# Patient Record
Sex: Female | Born: 1975 | Hispanic: No | Marital: Married | Smoking: Never smoker
Health system: Southern US, Community
[De-identification: ages and names within clinical notes are randomized; demographics above are authoritative.]

## PROBLEM LIST (undated history)

## (undated) DIAGNOSIS — G459 Transient cerebral ischemic attack, unspecified: Secondary | ICD-10-CM

## (undated) HISTORY — PX: TUBAL LIGATION: SHX77

## (undated) HISTORY — PX: APPENDECTOMY: SHX54

## (undated) HISTORY — DX: Transient cerebral ischemic attack, unspecified: G45.9

---

## 2005-07-19 ENCOUNTER — Observation Stay (HOSPITAL_COMMUNITY): Admission: EM | Admit: 2005-07-19 | Discharge: 2005-07-20 | Payer: Self-pay | Admitting: Emergency Medicine

## 2005-11-03 ENCOUNTER — Ambulatory Visit (HOSPITAL_COMMUNITY): Admission: RE | Admit: 2005-11-03 | Discharge: 2005-11-03 | Payer: Self-pay | Admitting: Obstetrics and Gynecology

## 2006-01-16 ENCOUNTER — Ambulatory Visit (HOSPITAL_COMMUNITY): Admission: AD | Admit: 2006-01-16 | Discharge: 2006-01-16 | Payer: Self-pay | Admitting: Obstetrics and Gynecology

## 2006-02-06 ENCOUNTER — Ambulatory Visit (HOSPITAL_COMMUNITY): Admission: RE | Admit: 2006-02-06 | Discharge: 2006-02-06 | Payer: Self-pay | Admitting: Obstetrics and Gynecology

## 2006-02-18 ENCOUNTER — Ambulatory Visit (HOSPITAL_COMMUNITY): Admission: AD | Admit: 2006-02-18 | Discharge: 2006-02-18 | Payer: Self-pay | Admitting: Obstetrics and Gynecology

## 2006-02-24 ENCOUNTER — Inpatient Hospital Stay (HOSPITAL_COMMUNITY): Admission: AD | Admit: 2006-02-24 | Discharge: 2006-02-27 | Payer: Self-pay | Admitting: Obstetrics and Gynecology

## 2006-04-24 ENCOUNTER — Ambulatory Visit (HOSPITAL_COMMUNITY): Admission: RE | Admit: 2006-04-24 | Discharge: 2006-04-24 | Payer: Self-pay | Admitting: Orthopedic Surgery

## 2006-11-22 ENCOUNTER — Emergency Department (HOSPITAL_COMMUNITY): Admission: EM | Admit: 2006-11-22 | Discharge: 2006-11-22 | Payer: Self-pay | Admitting: Emergency Medicine

## 2006-12-24 ENCOUNTER — Ambulatory Visit (HOSPITAL_COMMUNITY): Admission: RE | Admit: 2006-12-24 | Discharge: 2006-12-24 | Payer: Self-pay | Admitting: Internal Medicine

## 2007-06-15 ENCOUNTER — Other Ambulatory Visit: Admission: RE | Admit: 2007-06-15 | Discharge: 2007-06-15 | Payer: Self-pay | Admitting: Obstetrics and Gynecology

## 2009-03-18 ENCOUNTER — Emergency Department (HOSPITAL_COMMUNITY): Admission: EM | Admit: 2009-03-18 | Discharge: 2009-03-18 | Payer: Self-pay | Admitting: Emergency Medicine

## 2010-02-03 ENCOUNTER — Emergency Department (HOSPITAL_COMMUNITY): Admission: EM | Admit: 2010-02-03 | Discharge: 2010-02-04 | Payer: Self-pay | Admitting: Pediatrics

## 2010-12-10 LAB — POCT PREGNANCY, URINE: Preg Test, Ur: NEGATIVE

## 2010-12-10 LAB — URINALYSIS, ROUTINE W REFLEX MICROSCOPIC
Bilirubin Urine: NEGATIVE
Glucose, UA: NEGATIVE mg/dL
Hgb urine dipstick: NEGATIVE
Ketones, ur: NEGATIVE mg/dL
Nitrite: NEGATIVE
Protein, ur: NEGATIVE mg/dL
Specific Gravity, Urine: 1.01 (ref 1.005–1.030)
Urobilinogen, UA: 0.2 mg/dL (ref 0.0–1.0)
pH: 6 (ref 5.0–8.0)

## 2011-02-08 NOTE — Group Therapy Note (Signed)
Carrie [name], Carrie [name]           ACCOUNT NO.:  [id number]   MEDICAL RECORD NO.:  [id number]          PATIENT TYPE:  OIB   LOCATION:  LDR1                          FACILITY:  [facility]   PHYSICIAN:  Tilda Burrow, M.D. DATE OF BIRTH:  [birth year]   DATE OF PROCEDURE:  DATE OF DISCHARGE:  02/06/2006                                   PROGRESS NOTE   Karyme is about [redacted] weeks pregnant.  Came in with complaints of  contractions.  She was crying apparently when she came in, rating her pain a  10/10.  She was having contractions about every 4-5 minutes.  The fetal  heart rate was reactive; however, her cervix was still firm, finger tip,  uneffaced, -2 station.   At her request we gave her something for pain which was 10 mg IM of  morphine.  She went to sleep and basically quit contracting.  She was  observed in labor and delivery for approximately four hours.  Her cervix was  examined prior to discharge, and it was unchanged.   IMPRESSION:  1.  Intrauterine pregnancy at about 38 weeks.  2.  False labor.   PLAN:  Roneka was instructed to return to labor and delivery if she  resumed having contractions again that were stronger than what she had  already experienced, spontaneous rupture of membranes or any other concerns.      Jacklyn Shell, C.N.M.      Tilda Burrow, M.D.  Electronically Signed    FC/MEDQ  D:  02/06/2006  T:  02/07/2006  Job:  [id number]

## 2011-02-08 NOTE — Op Note (Signed)
[name], [name]           ACCOUNT NO.:  [id number]   MEDICAL RECORD NO.:  [id number]          PATIENT TYPE:  INP   LOCATION:  A413                          FACILITY:  [facility]   PHYSICIAN:  Tilda Burrow, M.D. DATE OF BIRTH:  [birth year]   DATE OF PROCEDURE:  02/25/2006  DATE OF DISCHARGE:                                 OPERATIVE REPORT   DELIVERY TIME:  1251 p.m.   LABOR SUMMARY AND DELIVERY NOTE:  Called to see patient shortly before noon  due to repetitive decelerations, intermittently into the 60s with variable  length from 15 to 60 seconds.  Good beat to beat variability persisted.  The  patient had reached 8 cm, -1 to 0 station, 100% effaced.  She was able to  progress.  We elevated the head between contractions.  The cervix stretched  over and she was completed dilated by 12:15.  Second stage variables were  notable.  The vertex was a right occiput transverse position during this  part of labor.  She progressed with good descent and at 12:31 we attempted  vacuum assistance at a +2 or lower station to see if the vertex would rotate  into better position.  We initially did not get a good seal so we waited and  approximately 15 minutes later attempted vacuum assistance again.  By this  time she had brought the baby down into an outlet position.  Vertex rotated  nicely through three contractions into a right occiput anterior position,  and delivered over a small median episiotomy without difficulty.  Once the  head was delivered, the left arm could be released from its anterior  position and the patient expelled with bulb suctioning of the nose and  pharynx prior to delivery of the rest of the body.  Amniotic fluid was clear  after deliver of the head.  Infant weighed 7 pounds 2.6 ounces, Apgars 9 and  9.  We will check blood gases.  Secondary episiotomy was repaired without  difficulty.  Postdelivery her temperature was 99.3 and pulse was 104.  She  appeared stable.   ESTIMATED BLOOD LOSS:  450 mL.   Placenta delivered intact.  Tomasa Blase presentation, 3-vessel cord confirmed.  The patient did well as did the baby.      Tilda Burrow, M.D.  Electronically Signed     JVF/MEDQ  D:  02/25/2006  T:  02/26/2006  Job:  [id number]

## 2011-02-08 NOTE — Group Therapy Note (Signed)
NAMEKORTNEY, POTVIN           ACCOUNT NO.:  [id number]   MEDICAL RECORD NO.:  [id number]          PATIENT TYPE:  OIB   LOCATION:  A415                          FACILITY:  [facility]   PHYSICIAN:  Tilda Burrow, M.D. DATE OF BIRTH:  [birth year]   DATE OF PROCEDURE:  DATE OF DISCHARGE:  02/18/2006                                   PROGRESS NOTE   Marguerette came in with complaints of decreased fetal movement and painful  contractions. She was having some mild contractions however her cervix was  unchanged from all of her other exams which was finger tip, very long.  Fetal heart rate was reactive without decelerations.  Her urinalysis was  negative. She was therefore discharged home with instructions to return to  labor and delivery if the contractions got worse.   IMPRESSION:  Intrauterine pregnancy at 38 weeks, pains of pregnancy.  Reactive non-stress test.      Jacklyn Shell, C.N.M.      Tilda Burrow, M.D.  Electronically Signed    FC/MEDQ  D:  02/20/2006  T:  02/20/2006  Job:  [id number]

## 2011-02-08 NOTE — Consult Note (Signed)
Carrie [name], Carrie [name]           ACCOUNT NO.:  [id number]   MEDICAL RECORD NO.:  [id number]          PATIENT TYPE:  OIB   LOCATION:  LDR1                          FACILITY:  [facility]   PHYSICIAN:  Tilda Burrow, M.D. DATE OF BIRTH:  1976-05-17   DATE OF CONSULTATION:  11/03/2005  DATE OF DISCHARGE:  11/03/2005                                   CONSULTATION   EMERGENCY ROOM VISIT:   CHIEF COMPLAINT:  Vaginal bleeding x2 days.   This 35 year old female gravida 1 para 0 now [redacted] weeks gestation by criteria  is seen in labor and delivery after calling the night before complaining of  vaginal bleeding, light spotting, described without any precipitating  incident, no intercourse, bowel movement, straining, etc., no gush of fluid.  The patient presents on November 03, 2005 where speculum exam reveals a  heavy vaginal discharge mildly malodorous.  Wet prep shows numerous clue  cells, no evidence of Trichomonas, cervix is fragile in spots with contact  with Q-Tip, GC and Chlamydia cultures are performed.  Transvaginal  ultrasound is performed limited by Jannifer Franklin.   Ultrasound reveals singleton vertex fetus with cervix long, closed, without  any evidence of placenta previa.   Wet prep results treated with MetroGel per vagina at bedtime x1 week.  The  patient's followup appointment previously scheduled for November 04, 2005  will be delayed 2 weeks.  Follow up [facility] 2 weeks.   ADDENDUM:  Fetal monitoring x15 minutes shows excellent fetal heart activity  and fetal movement.      Tilda Burrow, M.D.  Electronically Signed     JVF/MEDQ  D:  11/03/2005  T:  11/03/2005  Job:  [id number]

## 2011-02-08 NOTE — H&P (Signed)
NAMEKENOSHA, Carrie [name]           ACCOUNT NO.:  [id number]   MEDICAL RECORD NO.:  [id number]          PATIENT TYPE:  OBV   LOCATION:  A426                          FACILITY:  [facility]   PHYSICIAN:  Tilda Burrow, M.D. DATE OF BIRTH:  02-15-1976   DATE OF ADMISSION:  07/19/2005  DATE OF DISCHARGE:  LH                                HISTORY & PHYSICAL   CHIEF COMPLAINT:  Epigastric discomfort and abdominal pain at 7 to [redacted] weeks  gestation.   HISTORY OF PRESENT ILLNESS:  This 35 year old [place] female was seen in  the emergency room by Dr. Hilario Quarry on early morning hours of July 19, 2005 with complaints of upper abdominal discomfort of four days severity  without discharge or bleeding. She is stable condition. She had one prenatal  visit in our office last week with ultrasound performed at that time.  Records are not immediately available. Pelvic exam was negative in the  emergency room.   Laboratory evaluation included hemoglobin 14, hematocrit 42, white count  8,500, blood type A positive. Electrolytes showed BUN 5, creatinine 0.8.  Lipase is normal. Quantitative HCG of 127,000.   She was given fluids, Phenergan, and monitored. This discomfort in the  epigastric area is still considered a 4/10. She was admitted for overnight  observation with consideration of an ultrasound in the a.m. Since patient  has had an ultrasound at [facility] yesterday, we will attempt to  obtain ultrasound report from last week to confirm intrauterine location of  pregnancy. If so, ultrasound unlikely to be required.      Tilda Burrow, M.D.  Electronically Signed     JVF/MEDQ  D:  07/19/2005  T:  07/19/2005  Job:  [id number]   cc:   [facility]

## 2011-02-08 NOTE — H&P (Signed)
NAMEROBY, SPALLA           ACCOUNT NO.:  [id number]   MEDICAL RECORD NO.:  [id number]           PATIENT TYPE:   LOCATION:                                 FACILITY:   PHYSICIAN:  Tilda Burrow, M.D. DATE OF BIRTH:  1976/07/07   DATE OF ADMISSION:  02/24/2006  DATE OF DISCHARGE:  LH                                HISTORY & PHYSICAL   DATE OF ADMISSION:  February 24, 2006   REASON FOR ADMISSION:  Pregnancy at 39 weeks 2 days with prodromal labor on  and off during the weekend.  She was kept up since 3 a.m. this morning with  regular contractions.  They palpate mild to moderate here in the office.   MEDICAL HISTORY:  Negative.   SURGICAL HISTORY:  Positive for appendix.   ALLERGIES:  She has no known allergies.   FAMILY HISTORY:  Positive for coronary artery disease and cancer.   PRENATAL COURSE:  Essentially uneventful.  Blood type is A positive.  Rubella is immune.  Hepatitis B surface antigen negative.  HIV negative.  HSV negative.  Serology is nonreactive.  Pap is normal.  GC and chlamydia is  negative.  GBS is negative.  Repeat GC and chlamydia is also negative.  A 28-  week hemoglobin 13.3, 28-week hematocrit 37.8.  One-hour glucose 96.   PHYSICAL EXAMINATION TODAY:  VITAL SIGNS:  Weight is 166, blood pressure  130/80, fundal height is 39 cm. Fetal heart rate 130, strong and regular.  Fetal movement noted with Leopold's maneuver.  PELVIC:  Cervix is 1 cm, 70% effaced, and -2 station.  ABDOMEN:  The patient is having irregular contractions, mild to moderate in  palpation.   PLAN:  We are going to put her in this evening for a Foley bulb induction of  labor if she does not progress into labor by the end of the day.      Zerita Boers, Lanier Clam      Tilda Burrow, M.D.  Electronically Signed    DL/MEDQ  D:  40/98/1191  T:  02/24/2006  Job:  [id number]   cc:   Francoise Schaumann. Milford Cage DO, FAAP  Fax: [phone]

## 2011-02-08 NOTE — Op Note (Signed)
Carrie [name], Carrie [name] NO.:  [id number]   MEDICAL RECORD NO.:  [id number]          PATIENT TYPE:  INP   LOCATION:  A413                          FACILITY:  [facility]   PHYSICIAN:  Tilda Burrow, M.D. DATE OF BIRTH:  [birth year]   DATE OF PROCEDURE:  DATE OF DISCHARGE:                                 OPERATIVE REPORT   dictation cancelled by me      Tilda Burrow, M.D.  Electronically Signed     JVF/MEDQ  D:  02/25/2006  T:  02/26/2006  Job:  [id number]

## 2011-02-08 NOTE — Op Note (Signed)
NAMECHYRL, ELWELL           ACCOUNT NO.:  [id number]   MEDICAL RECORD NO.:  [id number]          PATIENT TYPE:  INP   LOCATION:  A413                          FACILITY:  [facility]   PHYSICIAN:  Lazaro Arms, M.D.   DATE OF BIRTH:  June 16, 1976   DATE OF PROCEDURE:  02/25/2006  DATE OF DISCHARGE:  02/27/2006                                 OPERATIVE REPORT   PROCEDURE:  Epidural placement.   Carrie [name] is a 35 year old female, gravida 1, in active phase of labor,  requesting epidural to be placed.  She was placed in sitting position.  Betadine prep was used.  Lidocaine 1% was used to numb the L3-L4 interspace.  The area was sterilely draped and a 17 gauge Tuohy needle was used and loss  of resistance technique employed and the epidural space found with one pass  without difficulty.  Then 10 mL of 0.125% bupivacaine plain was given into  the epidural space without difficulty.  An additional 10 mL was given to the  epidural catheter, which was fed without difficulty and taped down 5 cm from  the epidural space.  A continuous infusion of 0.125% bupivacaine plus 2  micrograms per mL of fentanyl was begun at 12 mL an hour.  The patient  tolerated the procedure well.  She is getting good pain-relief.      Lazaro Arms, M.D.  Electronically Signed     LHE/MEDQ  D:  04/10/2006  T:  04/10/2006  Job:  [id number]

## 2013-04-03 ENCOUNTER — Encounter (HOSPITAL_COMMUNITY): Payer: Self-pay

## 2013-04-03 ENCOUNTER — Emergency Department (HOSPITAL_COMMUNITY)
Admission: EM | Admit: 2013-04-03 | Discharge: 2013-04-03 | Disposition: A | Discharge status: Home / Self Care | Payer: 59 | Attending: Emergency Medicine | Admitting: Emergency Medicine

## 2013-04-03 ENCOUNTER — Emergency Department (HOSPITAL_COMMUNITY): Payer: 59

## 2013-04-03 DIAGNOSIS — Z9851 Tubal ligation status: Secondary | ICD-10-CM | POA: Insufficient documentation

## 2013-04-03 DIAGNOSIS — R0602 Shortness of breath: Secondary | ICD-10-CM | POA: Insufficient documentation

## 2013-04-03 DIAGNOSIS — R079 Chest pain, unspecified: Secondary | ICD-10-CM | POA: Insufficient documentation

## 2013-04-03 LAB — COMPREHENSIVE METABOLIC PANEL
ALT: 23 U/L (ref 0–35)
AST: 22 U/L (ref 0–37)
Albumin: 4.2 g/dL (ref 3.5–5.2)
BUN: 15 mg/dL (ref 6–23)
CO2: 29 mEq/L (ref 19–32)
Calcium: 9.9 mg/dL (ref 8.4–10.5)
Chloride: 101 mEq/L (ref 96–112)
Creatinine, Ser: 0.73 mg/dL (ref 0.50–1.10)
GFR calc non Af Amer: >90 mL/min (ref >90)
Glucose, Bld: 94 mg/dL (ref 70–99)
Total Bilirubin: 0.2 mg/dL — ABNORMAL LOW (ref 0.3–1.2)
Total Protein: 8.2 g/dL (ref 6.0–8.3)

## 2013-04-03 LAB — CBC WITH DIFFERENTIAL/PLATELET
Basophils Absolute: 0 10*3/uL (ref 0.0–0.1)
Basophils Relative: 0 % (ref 0–1)
Eosinophils Absolute: 0.1 10*3/uL (ref 0.0–0.7)
Eosinophils Relative: 1 % (ref 0–5)
HCT: 39.8 % (ref 36.0–46.0)
Hemoglobin: 14 g/dL (ref 12.0–15.0)
MCH: 31.2 pg (ref 26.0–34.0)
MCHC: 35.2 g/dL (ref 30.0–36.0)
MCV: 88.6 fL (ref 78.0–100.0)
Monocytes Absolute: 0.5 10*3/uL (ref 0.1–1.0)
Monocytes Relative: 7 % (ref 3–12)
Neutro Abs: 4.8 10*3/uL (ref 1.7–7.7)
Neutrophils Relative %: 64 % (ref 43–77)
RDW: 12.7 % (ref 11.5–15.5)
WBC: 7.5 10*3/uL (ref 4.0–10.5)

## 2013-04-03 LAB — TROPONIN I
Troponin I: <0.3 ng/mL (ref <0.30)
Troponin I: <0.3 ng/mL (ref <0.30)

## 2013-04-03 MED ORDER — ASPIRIN 325 MG PO TABS
324.0000 mg | ORAL_TABLET | Freq: Once | ORAL | Status: AC
  Administered 2013-04-03: 324 mg via ORAL
  Filled 2013-04-03: qty 1

## 2013-04-03 NOTE — ED Notes (Signed)
Po fluids and snack provided.  

## 2013-04-03 NOTE — ED Notes (Signed)
Gave patient sponge to wet mouth

## 2013-04-03 NOTE — ED Provider Notes (Signed)
History  This chart was scribed for Carrie Gaskins, MD by Bennett Scrape, ED Scribe. This patient was seen in room APA10/APA10 and the patient's care was started at 7:23 AM.  CSN: [id number]  Arrival date & time 04/03/13  0714   First MD Initiated Contact with Patient 04/03/13 [phone]     Chief Complaint  Patient presents with  . Chest Pain    Patient is a 37 y.o. female presenting with chest pain. The history is provided by the patient. No language interpreter was used.  Chest Pain Pain location:  L chest Pain radiates to:  L arm Pain radiates to the back: no   Pain severity:  Moderate Duration:  1 day Timing:  Constant Progression:  Improving Chronicity:  New Worsened by:  Deep breathing Ineffective treatments:  None tried Associated symptoms: shortness of breath   Associated symptoms: no abdominal pain, no cough, no fever and no nausea     HPI Comments: Carrie [name] is a 38 y.o. female who presents to the Emergency Department complaining of intermittent left-sided CP that started yesterday but became constant overnight with associated SOB. She reports that the pain occasionally radiates down her left arm and admits that the pain has improved without medications since last night. She reports minimal worsening of pain with deep breathing. She states that she felt a similar brief pain 5 days ago but it resolved on it's own. She denies any prior episodes of similar symptoms. She denies any recent cough, fever, abdominal pain, syncope and nausea as associated symptoms. She denies any h/o cardiac or pulmonary conditions and denies any prior PE or DVT diagnoses. She has a family h/o early MI with her mother and brother. Pt does not have a h/o chronic medical conditions and she denies smoking and alcohol use.  PMH - none  Past Surgical History  Procedure Laterality Date  . Tubal ligation    . Appendectomy     No family history on file. History  Substance Use Topics  .  Smoking status: Never Smoker   . Smokeless tobacco: Not on file  . Alcohol Use: No   No OB history provided.  Review of Systems  Constitutional: Negative for fever.  Respiratory: Positive for shortness of breath. Negative for cough.   Cardiovascular: Positive for chest pain.  Gastrointestinal: Negative for nausea and abdominal pain.  All other systems reviewed and are negative.    Allergies  Ciprofloxacin  Home Medications  No current outpatient prescriptions on file.  Triage Vitals: BP 128/87  Pulse 94  Temp(Src) 98 F (36.7 C) (Oral)  Resp 18  Ht 5\' 5"  (1.651 m)  Wt 165 lb (74.844 kg)  BMI 27.46 kg/m2  SpO2 97%  LMP 03/23/2013  Physical Exam  Nursing note and vitals reviewed.  CONSTITUTIONAL: Well developed/well nourished HEAD: Normocephalic/atraumatic EYES: EOMI/PERRL ENMT: Mucous membranes moist NECK: supple no meningeal signs SPINE:entire spine nontender CV: S1/S2 noted, no murmurs/rubs/gallops noted LUNGS: Lungs are clear to auscultation bilaterally, no apparent distress ABDOMEN: soft, nontender, no rebound or guarding GU:no cva tenderness NEURO: Pt is awake/alert, moves all extremitiesx4 EXTREMITIES: pulses normal, full ROM SKIN: warm, color normal PSYCH: no abnormalities of mood noted  ED Course  Procedures   Medications  aspirin tablet 324 mg (324 mg Oral Given 04/03/13 0742)    DIAGNOSTIC STUDIES: Oxygen Saturation is 97% on room air, normal by my interpretation.    COORDINATION OF CARE: 7:31 AM-Discussed treatment plan which includes ASA, CXR, CBC panel, CMP and  troponin with pt at bedside and pt agreed to plan.  8:59 AM Pt now pain free She is well appearing, appears low risk for ACS.  Will repeat troponin/ekg at 3 hr mark.  Suspicion for PE/Dissection is low 11:56 AM Pt stable, pain free, no further complaints, will d/c home Discussed strict return precautions  Labs Reviewed  COMPREHENSIVE METABOLIC PANEL - Abnormal; Notable for the  following:    Total Bilirubin 0.2 (*)    All other components within normal limits  CBC WITH DIFFERENTIAL  TROPONIN I   Dg Chest Portable 1 View  04/03/2013   *RADIOLOGY REPORT*  Clinical Data: Chest pain.  CHEST - 1 VIEW  Comparison:  02/04/2010  Findings: The heart size and mediastinal contours are within normal limits.  Both lungs are clear.  IMPRESSION: No active disease.   Original Report Authenticated By: Irish Lack, M.D.      MDM  Nursing notes including past medical history and social history reviewed and considered in documentation xrays reviewed and considered Labs/vital reviewed and considered      Date: 04/03/2013 0732  Rate: 98  Rhythm: normal sinus rhythm  QRS Axis: normal  Intervals: normal  ST/T Wave abnormalities: normal  Conduction Disutrbances:none   Date: 04/03/2013 1057am  Rate: 76  Rhythm: normal sinus rhythm  QRS Axis: normal  Intervals: normal  ST/T Wave abnormalities: nonspecific ST changes  Conduction Disutrbances:none  Narrative Interpretation:   Old EKG Reviewed: no significant changes from earlier EKG ?inverted t wave in V2, but no other acute changes from prior      I personally performed the services described in this documentation, which was scribed in my presence. The recorded information has been reviewed and is accurate.          Carrie Gaskins, MD 04/03/13 [phone]

## 2013-04-03 NOTE — ED Notes (Signed)
Pt reports that she started having left side cp that started on Monday, she stated that Monday the pain was the worst. Pain has come and gone al week, but the pain is has been constand during the night, pain radiates down her left arm at times, denies any n/v, +sob at times. H/o early mi in mother and brother.

## 2013-04-12 ENCOUNTER — Telehealth: Payer: Self-pay

## 2013-04-26 ENCOUNTER — Encounter: Payer: Managed Care, Other (non HMO) | Admitting: Adult Health

## 2013-04-27 ENCOUNTER — Encounter: Payer: Managed Care, Other (non HMO) | Admitting: Cardiovascular Disease

## 2016-03-08 ENCOUNTER — Telehealth: Payer: Self-pay | Admitting: Allergy and Immunology

## 2016-03-08 NOTE — Telephone Encounter (Signed)
Has a question about if her bill already passed through her insurance.

## 2016-03-08 NOTE — Telephone Encounter (Signed)
WILL PAY $25 EVERY 2 WEEKS BEGINNGING 03-15-16

## 2016-10-18 ENCOUNTER — Other Ambulatory Visit (HOSPITAL_COMMUNITY): Payer: Self-pay | Admitting: Internal Medicine

## 2016-10-18 DIAGNOSIS — R1011 Right upper quadrant pain: Secondary | ICD-10-CM

## 2016-10-21 ENCOUNTER — Ambulatory Visit (HOSPITAL_COMMUNITY)
Admission: RE | Admit: 2016-10-21 | Discharge: 2016-10-21 | Disposition: A | Discharge status: Home / Self Care | Payer: BLUE CROSS/BLUE SHIELD | Source: Ambulatory Visit | Attending: Internal Medicine | Admitting: Internal Medicine

## 2016-10-21 DIAGNOSIS — R1011 Right upper quadrant pain: Secondary | ICD-10-CM | POA: Diagnosis not present

## 2016-11-26 ENCOUNTER — Telehealth: Payer: Self-pay | Admitting: Allergy & Immunology

## 2016-11-26 MED ORDER — EPINEPHRINE 0.3 MG/0.3ML IJ SOAJ: 0.3000 mg | Freq: Once | INTRAMUSCULAR | 2 refills | Status: AC

## 2016-11-26 NOTE — Telephone Encounter (Signed)
Ms. 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 I will send in an AuviQ 0.3mg  to the [facility].   Malachi Bonds, MD FAAAAI [facility]

## 2018-02-02 DIAGNOSIS — Z1231 Encounter for screening mammogram for malignant neoplasm of breast: Secondary | ICD-10-CM | POA: Diagnosis not present

## 2018-03-04 DIAGNOSIS — Z6828 Body mass index (BMI) 28.0-28.9, adult: Secondary | ICD-10-CM | POA: Diagnosis not present

## 2018-03-04 DIAGNOSIS — Z01419 Encounter for gynecological examination (general) (routine) without abnormal findings: Secondary | ICD-10-CM | POA: Diagnosis not present

## 2018-03-04 DIAGNOSIS — E039 Hypothyroidism, unspecified: Secondary | ICD-10-CM | POA: Diagnosis not present

## 2018-03-23 DIAGNOSIS — G459 Transient cerebral ischemic attack, unspecified: Secondary | ICD-10-CM

## 2018-03-23 HISTORY — DX: Transient cerebral ischemic attack, unspecified: G45.9

## 2018-03-31 DIAGNOSIS — Z6831 Body mass index (BMI) 31.0-31.9, adult: Secondary | ICD-10-CM | POA: Diagnosis not present

## 2018-03-31 DIAGNOSIS — R1011 Right upper quadrant pain: Secondary | ICD-10-CM | POA: Diagnosis not present

## 2018-04-02 ENCOUNTER — Encounter (HOSPITAL_COMMUNITY): Payer: Self-pay | Admitting: *Deleted

## 2018-04-02 ENCOUNTER — Other Ambulatory Visit: Payer: Self-pay

## 2018-04-02 ENCOUNTER — Emergency Department (HOSPITAL_COMMUNITY): Payer: BLUE CROSS/BLUE SHIELD

## 2018-04-02 ENCOUNTER — Observation Stay (HOSPITAL_COMMUNITY): Payer: BLUE CROSS/BLUE SHIELD

## 2018-04-02 ENCOUNTER — Observation Stay (HOSPITAL_COMMUNITY)
Admission: EM | Admit: 2018-04-02 | Discharge: 2018-04-03 | Disposition: A | Discharge status: Home / Self Care | Payer: BLUE CROSS/BLUE SHIELD | Attending: Internal Medicine | Admitting: Internal Medicine

## 2018-04-02 DIAGNOSIS — R51 Headache: Secondary | ICD-10-CM | POA: Diagnosis not present

## 2018-04-02 DIAGNOSIS — R200 Anesthesia of skin: Secondary | ICD-10-CM | POA: Diagnosis not present

## 2018-04-02 DIAGNOSIS — Z79899 Other long term (current) drug therapy: Secondary | ICD-10-CM | POA: Diagnosis not present

## 2018-04-02 DIAGNOSIS — G459 Transient cerebral ischemic attack, unspecified: Secondary | ICD-10-CM | POA: Diagnosis not present

## 2018-04-02 LAB — CBC WITH DIFFERENTIAL/PLATELET
Basophils Absolute: 0 10*3/uL (ref 0.0–0.1)
Basophils Relative: 0 %
Eosinophils Absolute: 0.1 10*3/uL (ref 0.0–0.7)
Eosinophils Relative: 1 %
HCT: 43.4 % (ref 36.0–46.0)
Hemoglobin: 14.8 g/dL (ref 12.0–15.0)
Lymphocytes Relative: 42 %
Lymphs Abs: 2.5 10*3/uL (ref 0.7–4.0)
MCH: 30 pg (ref 26.0–34.0)
MCHC: 34.1 g/dL (ref 30.0–36.0)
MCV: 87.9 fL (ref 78.0–100.0)
Monocytes Absolute: 0.5 10*3/uL (ref 0.1–1.0)
Monocytes Relative: 8 %
Neutro Abs: 2.8 10*3/uL (ref 1.7–7.7)
Neutrophils Relative %: 49 %
Platelets: 262 10*3/uL (ref 150–400)
RBC: 4.94 MIL/uL (ref 3.87–5.11)
RDW: 12.8 % (ref 11.5–15.5)
WBC: 5.9 10*3/uL (ref 4.0–10.5)

## 2018-04-02 LAB — I-STAT BETA HCG BLOOD, ED (MC, WL, AP ONLY): I-stat hCG, quantitative: <5 m[IU]/mL (ref <5)

## 2018-04-02 LAB — BASIC METABOLIC PANEL
Anion gap: 8 (ref 5–15)
BUN: 12 mg/dL (ref 6–20)
CO2: 26 mmol/L (ref 22–32)
Calcium: 10 mg/dL (ref 8.9–10.3)
Chloride: 105 mmol/L (ref 98–111)
Creatinine, Ser: 0.79 mg/dL (ref 0.44–1.00)
GFR calc Af Amer: >60 mL/min (ref >60)
GFR calc non Af Amer: >60 mL/min (ref >60)
Glucose, Bld: 89 mg/dL (ref 70–99)
Potassium: 3.9 mmol/L (ref 3.5–5.1)
Sodium: 139 mmol/L (ref 135–145)

## 2018-04-02 MED ORDER — SENNOSIDES-DOCUSATE SODIUM 8.6-50 MG PO TABS: 1.0000 | ORAL_TABLET | Freq: Every evening | ORAL | Status: DC | PRN

## 2018-04-02 MED ORDER — ACETAMINOPHEN 325 MG PO TABS
650.0000 mg | ORAL_TABLET | Freq: Four times a day (QID) | ORAL | Status: DC | PRN
Start: 2018-04-02 — End: 2018-04-02
  Administered 2018-04-02: 650 mg via ORAL
  Filled 2018-04-02: qty 2

## 2018-04-02 MED ORDER — ENOXAPARIN SODIUM 40 MG/0.4ML ~~LOC~~ SOLN
40.0000 mg | SUBCUTANEOUS | Status: DC
  Administered 2018-04-02: 40 mg via SUBCUTANEOUS
  Filled 2018-04-02 (×3): qty 0.4

## 2018-04-02 MED ORDER — ASPIRIN 81 MG PO CHEW
81.0000 mg | CHEWABLE_TABLET | Freq: Every day | ORAL | Status: DC
  Administered 2018-04-02 – 2018-04-03 (×2): 81 mg via ORAL
  Filled 2018-04-02 (×4): qty 1

## 2018-04-02 MED ORDER — ACETAMINOPHEN 325 MG PO TABS: 650.0000 mg | ORAL_TABLET | ORAL | Status: DC | PRN

## 2018-04-02 MED ORDER — ACETAMINOPHEN 650 MG RE SUPP: 650.0000 mg | RECTAL | Status: DC | PRN

## 2018-04-02 MED ORDER — ACETAMINOPHEN 160 MG/5ML PO SOLN: 650.0000 mg | ORAL | Status: DC | PRN

## 2018-04-02 MED ORDER — SODIUM CHLORIDE 0.9 % IV SOLN
INTRAVENOUS | Status: DC
  Administered 2018-04-02 – 2018-04-03 (×2): via INTRAVENOUS

## 2018-04-02 MED ORDER — ADULT MULTIVITAMIN W/MINERALS CH
1.0000 | ORAL_TABLET | Freq: Every day | ORAL | Status: DC
  Administered 2018-04-03: 1 via ORAL
  Filled 2018-04-02: qty 1

## 2018-04-02 MED ORDER — STROKE: EARLY STAGES OF RECOVERY BOOK
Freq: Once | Status: AC
  Administered 2018-04-03: 10:00:00
  Filled 2018-04-02: qty 1

## 2018-04-02 NOTE — ED Triage Notes (Signed)
Pt c/o right sided facial and right arm numbness along with blurry vision that started at 0630 this morning. Pt reports feeling normal when she woke up this morning. Pt denies weakness.

## 2018-04-02 NOTE — H&P (Signed)
History and Physical    Carrie [name] ZOX:[id number] DOB: 08/26/1976 DOA: 04/02/2018  Referring MD/NP/PA: Raeford Razor, EDP PCP: Carylon Perches, MD  Patient coming from: Home  Chief Complaint: Right face and arm numbness  HPI: Carrie [name] is a 42 y.o. female without significant past medical history who was driving to work at around 6:30 AM when she noticed that her lips started to "feel funny".  She then noticed that the feeling spread to her right cheek and her scalp.  She started touching her cheek and pulling on her hair and noticed a difference between the right and left sides.  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  She subsequently noticed that the numbness spread to her right shoulder and down to her right forearm and fingertips.  At that point the work RN recommended hospital transfer.  Symptoms lasted approximately an hour and 45 minutes before complete resolution.  In the ED her vital signs have been stable, lab work is essentially unremarkable, CT scan of the head is negative for acute changes.  Admission has been requested for further evaluation and management.  Past Medical/Surgical History: History reviewed. No pertinent past medical history.  Past Surgical History:  Procedure Laterality Date  . APPENDECTOMY    . TUBAL LIGATION      Social History:  reports that she has never smoked. She has never used smokeless tobacco. She reports that she does not drink alcohol or use drugs.  Allergies: Allergies  Allergen Reactions  . Shellfish Allergy Anaphylaxis and Swelling  . Ciprofloxacin Hives    Family History:  Patient is not aware family history of significance including heart disease, stroke.  Prior to Admission medications   Medication Sig Start Date End Date Taking? Authorizing Provider  Multiple Vitamins-Minerals (ALIVE WOMENS ENERGY PO) Take 1  tablet by mouth daily.   Yes [provider]    Review of Systems:  Constitutional: Denies fever, chills, diaphoresis, appetite change and fatigue.  HEENT: Denies photophobia, eye pain, redness, hearing loss, ear pain, congestion, sore throat, rhinorrhea, sneezing, mouth sores, trouble swallowing, neck pain, neck stiffness and tinnitus.   Respiratory: Denies SOB, DOE, cough, chest tightness,  and wheezing.   Cardiovascular: Denies chest pain, palpitations and leg swelling.  Gastrointestinal: Denies nausea, vomiting, abdominal pain, diarrhea, constipation, blood in stool and abdominal distention.  Genitourinary: Denies dysuria, urgency, frequency, hematuria, flank pain and difficulty urinating.  Endocrine: Denies: hot or cold intolerance, sweats, changes in hair or nails, polyuria, polydipsia. Musculoskeletal: Denies myalgias, back pain, joint swelling, arthralgias and gait problem.  Skin: Denies pallor, rash and wound.  Neurological: Denies seizures, syncope, weakness, light-headedness. Hematological: Denies adenopathy. Easy bruising, personal or family bleeding history  Psychiatric/Behavioral: Denies suicidal ideation, mood changes, confusion, nervousness, sleep disturbance and agitation    Physical Exam: Vitals:   04/02/18 1100 04/02/18 1130 04/02/18 1230 04/02/18 1311  BP: 119/79 123/87 114/75 112/75  Pulse: 63 70 65 65  Resp: 16 15 17 16   Temp:    98.4 F (36.9 C)  TempSrc:    Oral  SpO2: 100% 100% 100% 100%  Weight:    73.1 kg (161 lb 2.5 oz)  Height:    5\' 1"  (1.549 m)     Constitutional: NAD, calm, comfortable Eyes: PERRL, lids and conjunctivae normal ENMT: Mucous membranes are moist. Posterior pharynx clear of any exudate or lesions.Normal dentition.  Neck: normal, supple, no masses, no thyromegaly Respiratory: clear to auscultation bilaterally, no wheezing, no crackles. Normal respiratory effort. No accessory muscle use.  Cardiovascular: Regular rate and  rhythm, no murmurs / rubs / gallops. No extremity edema. 2+ pedal pulses. No carotid bruits.  Abdomen: no tenderness, no masses palpated. No hepatosplenomegaly. Bowel sounds positive.  Musculoskeletal: no clubbing / cyanosis. No joint deformity upper and lower extremities. Good ROM, no contractures. Normal muscle tone.  Skin: no rashes, lesions, ulcers. No induration Neurologic: CN 2-12 grossly intact. Sensation intact, DTR normal. Strength 5/5 in all 4.  Psychiatric: Normal judgment and insight. Alert and oriented x 3. Normal mood.    Labs on Admission: I have personally reviewed the following labs and imaging studies  CBC: Recent Labs  Lab 04/02/18 1101  WBC 5.9  NEUTROABS 2.8  HGB 14.8  HCT 43.4  MCV 87.9  PLT 262   Basic Metabolic Panel: Recent Labs  Lab 04/02/18 1101  NA 139  K 3.9  CL 105  CO2 26  GLUCOSE 89  BUN 12  CREATININE 0.79  CALCIUM 10.0   GFR: Estimated Creatinine Clearance: 82.9 mL/min (by C-G formula based on SCr of 0.79 mg/dL). Liver Function Tests: No results for input(s): AST, ALT, ALKPHOS, BILITOT, PROT, ALBUMIN in the last 168 hours. No results for input(s): LIPASE, AMYLASE in the last 168 hours. No results for input(s): AMMONIA in the last 168 hours. Coagulation Profile: No results for input(s): INR, PROTIME in the last 168 hours. Cardiac Enzymes: No results for input(s): CKTOTAL, CKMB, CKMBINDEX, TROPONINI in the last 168 hours. BNP (last 3 results) No results for input(s): PROBNP in the last 8760 hours. HbA1C: No results for input(s): HGBA1C in the last 72 hours. CBG: No results for input(s): GLUCAP in the last 168 hours. Lipid Profile: No results for input(s): CHOL, HDL, LDLCALC, TRIG, CHOLHDL, LDLDIRECT in the last 72 hours. Thyroid Function Tests: No results for input(s): TSH, T4TOTAL, FREET4, T3FREE, THYROIDAB in the last 72 hours. Anemia Panel: No results for input(s): VITAMINB12, FOLATE, FERRITIN, TIBC, IRON, RETICCTPCT in the  last 72 hours. Urine analysis:    Component Value Date/Time   COLORURINE YELLOW 02/04/2010 0002   APPEARANCEUR CLEAR 02/04/2010 0002   LABSPEC 1.010 02/04/2010 0002   PHURINE 6.0 02/04/2010 0002   GLUCOSEU NEGATIVE 02/04/2010 0002   HGBUR NEGATIVE 02/04/2010 0002   BILIRUBINUR NEGATIVE 02/04/2010 0002   KETONESUR NEGATIVE 02/04/2010 0002   PROTEINUR NEGATIVE 02/04/2010 0002   UROBILINOGEN 0.2 02/04/2010 0002   NITRITE NEGATIVE 02/04/2010 0002   LEUKOCYTESUR  02/04/2010 0002    NEGATIVE MICROSCOPIC NOT DONE ON URINES WITH NEGATIVE PROTEIN, BLOOD, LEUKOCYTES, NITRITE, OR GLUCOSE <1000 mg/dL.   Sepsis Labs: @LABRCNTIP (procalcitonin:4,lacticidven:4) )No results found for this or any previous visit (from the past 240 hour(s)).   Radiological Exams on Admission: Ct Head Wo Contrast  Result Date: 04/02/2018 CLINICAL DATA:  Right upper extremity and facial numbness. Blurred vision. EXAM: CT HEAD WITHOUT CONTRAST TECHNIQUE: Contiguous axial images were obtained from the base of the skull through the vertex without intravenous contrast. COMPARISON:  Feb 04, 2010 FINDINGS: Brain: Ventricles are normal in size and configuration. There is no intracranial mass, hemorrhage, extra-axial fluid collection, or midline shift. Gray-white compartments appear normal. No evident acute infarct. Vascular: No hyperdense vessel. No appreciable vascular calcification. Skull: The bony calvarium appears intact. Sinuses/Orbits: There is mucosal thickening in several ethmoid air cells. Frontal sinuses are aplastic. Other visualized paranasal sinuses are clear. Visualized orbits appear symmetric bilaterally.  Other: Mastoids on the right are hypoplastic. Mastoid air cells are clear bilaterally. IMPRESSION: Mucosal thickening in several ethmoid air cells. Study otherwise unremarkable. Electronically Signed   By: Bretta BangWilliam  Woodruff III M.D.   On: 04/02/2018 12:00    EKG: Independently reviewed.  Normal sinus rhythm, no acute  ischemic changes  Assessment/Plan Principal Problem:   TIA (transient ischemic attack)    TIA -This is the most likely etiology of her right facial and arm numbness that has completely resolved at this time. -We will request MRI, carotid Dopplers, 2D echo. -PT/OT evaluations requested as well. -Start aspirin for secondary stroke prevention.   DVT prophylaxis: Lovenox Code Status: Full code Family Communication: Patient only Disposition Plan: Anticipate discharge home in 24 hours Consults called: None Admission status: It is my clinical opinion that referral for OBSERVATION is reasonable and necessary in this patient based on the above information provided. The aforementioned taken together are felt to place the patient at high risk for further clinical deterioration. However it is anticipated that the patient may be medically stable for discharge from the hospital within 24 to 48 hours.      Time Spent: 85 minutes  Bernece Gall Philip AspenHernandez Acosta MD [facility] Pager [phone]  If 7PM-7AM, please contact night-coverage [email or web address] Password [email or web address]  04/02/2018, 5:35 PM

## 2018-04-03 ENCOUNTER — Observation Stay (HOSPITAL_BASED_OUTPATIENT_CLINIC_OR_DEPARTMENT_OTHER): Payer: BLUE CROSS/BLUE SHIELD

## 2018-04-03 ENCOUNTER — Observation Stay (HOSPITAL_COMMUNITY): Payer: BLUE CROSS/BLUE SHIELD

## 2018-04-03 DIAGNOSIS — G459 Transient cerebral ischemic attack, unspecified: Secondary | ICD-10-CM

## 2018-04-03 DIAGNOSIS — I6523 Occlusion and stenosis of bilateral carotid arteries: Secondary | ICD-10-CM | POA: Diagnosis not present

## 2018-04-03 LAB — ECHOCARDIOGRAM COMPLETE
HEIGHTINCHES: 61 in
Weight: 2578.5 oz

## 2018-04-03 LAB — LIPID PANEL
CHOLESTEROL: 181 mg/dL (ref 0–200)
HDL: 61 mg/dL (ref >40)
LDL CALC: 102 mg/dL — AB (ref 0–99)
Total CHOL/HDL Ratio: 3 RATIO
Triglycerides: 91 mg/dL (ref <150)
VLDL: 18 mg/dL (ref 0–40)

## 2018-04-03 LAB — HEMOGLOBIN A1C
Hgb A1c MFr Bld: 5.5 % (ref 4.8–5.6)
Mean Plasma Glucose: 111.15 mg/dL

## 2018-04-03 LAB — HIV ANTIBODY (ROUTINE TESTING W REFLEX): HIV Screen 4th Generation wRfx: NONREACTIVE

## 2018-04-03 MED ORDER — SIMVASTATIN 20 MG PO TABS: 20.0000 mg | ORAL_TABLET | Freq: Every day | ORAL | Status: DC

## 2018-04-03 MED ORDER — SIMVASTATIN 20 MG PO TABS: 20.0000 mg | ORAL_TABLET | Freq: Every day | ORAL | 2 refills | Status: AC

## 2018-04-03 MED ORDER — ASPIRIN 81 MG PO CHEW: 81.0000 mg | CHEWABLE_TABLET | Freq: Every day | ORAL | Status: AC

## 2018-04-03 NOTE — Discharge Summary (Signed)
Physician Discharge Summary  Carrie [name] UJW:[id number] DOB: 1975-11-19 DOA: 04/02/2018  PCP: Carylon Perches, MD  Admit date: 04/02/2018 Discharge date: 04/03/2018  Time spent: 45 minutes  Recommendations for Outpatient Follow-up:  -To be discharged home today. -Advise follow-up with PCP in 2 weeks.  Discharge Diagnoses:  Principal Problem:   TIA (transient ischemic attack)   Discharge Condition: Stable and improved  Filed Weights   04/02/18 0926 04/02/18 1311  Weight: 72.6 kg (160 lb) 73.1 kg (161 lb 2.5 oz)    History of present illness:   Carrie [name] is a 42 y.o. female without significant past medical history who was driving to work at around 6:30 AM when she noticed that her lips started to "feel funny".  She then noticed that the feeling spread to her right cheek and her scalp.  She started touching her cheek and pulling on her hair and noticed a difference between the right and left sides.  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  She subsequently noticed that the numbness spread to her right shoulder and down to her right forearm and fingertips.  At that point the work RN recommended hospital transfer.  Symptoms lasted approximately an hour and 45 minutes before complete resolution.  In the ED her vital signs have been stable, lab work is essentially unremarkable, CT scan of the head is negative for acute changes.  Admission has been requested for further evaluation and management.    Hospital Course:   TIA -MRI confirms no acute changes. -PT does not recommend follow-up. -2D echo: Ejection fraction of 50 to 55% with normal wall motion and normal left ventricular diastolic function. -Carotid Dopplers: Than 50% ICA stenosis bilaterally, patent antegrade vertebral flow bilaterally. -We will start on 20 mg of simvastatin given an LDL of 102, aspirin 81 mg  has also been recommended.  Procedures:  As above   Consultations:  None  Discharge Instructions  Discharge Instructions    Diet - low sodium heart healthy   Complete by:  As directed    Increase activity slowly   Complete by:  As directed      Allergies as of 04/03/2018      Reactions   Shellfish Allergy Anaphylaxis, Swelling   Ciprofloxacin Hives      Medication List    TAKE these medications   ALIVE WOMENS ENERGY PO Take 1 tablet by mouth daily.   aspirin 81 MG chewable tablet Chew 1 tablet (81 mg total) by mouth daily. Start taking on:  04/04/2018   simvastatin 20 MG tablet Commonly known as:  ZOCOR Take 1 tablet (20 mg total) by mouth daily at 6 PM.      Allergies  Allergen Reactions  . Shellfish Allergy Anaphylaxis and Swelling  . Ciprofloxacin Hives   Follow-up Information    Carylon Perches, MD. Schedule an appointment as soon as possible for a visit in 2 week(s).   Specialty:  Internal Medicine Contact information: [address] [place] [address] [phone]            The results of significant diagnostics from this hospitalization (including imaging, microbiology, ancillary and laboratory) are listed below for reference.    Significant Diagnostic Studies: Ct Head Wo Contrast  Result Date: 04/02/2018 CLINICAL DATA:  Right upper extremity and facial numbness. Blurred vision. EXAM: CT HEAD WITHOUT CONTRAST TECHNIQUE: Contiguous  axial images were obtained from the base of the skull through the vertex without intravenous contrast. COMPARISON:  Feb 04, 2010 FINDINGS: Brain: Ventricles are normal in size and configuration. There is no intracranial mass, hemorrhage, extra-axial fluid collection, or midline shift. Gray-white compartments appear normal. No evident acute infarct. Vascular: No hyperdense vessel. No appreciable vascular calcification. Skull: The bony calvarium appears intact. Sinuses/Orbits: There is mucosal thickening in several  ethmoid air cells. Frontal sinuses are aplastic. Other visualized paranasal sinuses are clear. Visualized orbits appear symmetric bilaterally. Other: Mastoids on the right are hypoplastic. Mastoid air cells are clear bilaterally. IMPRESSION: Mucosal thickening in several ethmoid air cells. Study otherwise unremarkable. Electronically Signed   By: Bretta Bang III M.D.   On: 04/02/2018 12:00   Mr Brain Wo Contrast  Result Date: 04/02/2018 CLINICAL DATA:  42 y/o F; right-sided facial and right arm numbness with blurry vision, now resolved. EXAM: MRI HEAD WITHOUT CONTRAST TECHNIQUE: Multiplanar, multiecho pulse sequences of the brain and surrounding structures were obtained without intravenous contrast. COMPARISON:  04/02/2018 CT head. FINDINGS: Brain: No acute infarction, hemorrhage, hydrocephalus, extra-axial collection or mass lesion. Vascular: Normal flow voids. Skull and upper cervical spine: Normal marrow signal. Sinuses/Orbits: Mild ethmoid sinus mucosal thickening. No abnormal signal of mastoid air cells. Orbits are unremarkable. Other: None. IMPRESSION: No acute intracranial abnormality.  Unremarkable MRI of the brain. Electronically Signed   By: Mitzi Hansen M.D.   On: 04/02/2018 19:29   US Carotid Bilateral (at [facility] Only)  Result Date: 04/03/2018 CLINICAL DATA:  Right-sided facial paresthesias, upper extremity numbness, blurred vision EXAM: BILATERAL CAROTID DUPLEX ULTRASOUND TECHNIQUE: Wallace Cullens scale imaging, color Doppler and duplex ultrasound were performed of bilateral carotid and vertebral arteries in the neck. COMPARISON:  04/02/2018 FINDINGS: Criteria: Quantification of carotid stenosis is based on velocity parameters that correlate the residual internal carotid diameter with NASCET-based stenosis levels, using the diameter of the distal internal carotid lumen as the denominator for stenosis measurement. The following velocity measurements were obtained: RIGHT ICA: 99/44  cm/sec CCA: 85/24 cm/sec SYSTOLIC ICA/CCA RATIO:  1.2 ECA:  86 cm/sec LEFT ICA: 90/41 cm/sec CCA: 90/28 cm/sec SYSTOLIC ICA/CCA RATIO:  1.0 ECA:  60 cm/sec RIGHT CAROTID ARTERY: Minor intimal thickening without significant atherosclerosis. No hemodynamically significant right ICA stenosis, velocity elevation, or turbulent flow. Degree of narrowing less than 50%. RIGHT VERTEBRAL ARTERY:  Antegrade LEFT CAROTID ARTERY: Similar minor intimal thickening without significant atherosclerosis. No hemodynamically significant left ICA stenosis, velocity elevation, or turbulent flow. LEFT VERTEBRAL ARTERY:  Antegrade IMPRESSION: Minor carotid intimal thickening. No significant atherosclerosis or ICA stenosis by ultrasound. Degree of narrowing less than 50% bilaterally by ultrasound criteria. Patent antegrade vertebral flow bilaterally Electronically Signed   By: Judie Petit.  Shick M.D.   On: 04/03/2018 11:12    Microbiology: No results found for this or any previous visit (from the past 240 hour(s)).   Labs: Basic Metabolic Panel: Recent Labs  Lab 04/02/18 1101  NA 139  K 3.9  CL 105  CO2 26  GLUCOSE 89  BUN 12  CREATININE 0.79  CALCIUM 10.0   Liver Function Tests: No results for input(s): AST, ALT, ALKPHOS, BILITOT, PROT, ALBUMIN in the last 168 hours. No results for input(s): LIPASE, AMYLASE in the last 168 hours. No results for input(s): AMMONIA in the last 168 hours. CBC: Recent Labs  Lab 04/02/18 1101  WBC 5.9  NEUTROABS 2.8  HGB 14.8  HCT 43.4  MCV 87.9  PLT 262   Cardiac  Enzymes: No results for input(s): CKTOTAL, CKMB, CKMBINDEX, TROPONINI in the last 168 hours. BNP: BNP (last 3 results) No results for input(s): BNP in the last 8760 hours.  ProBNP (last 3 results) No results for input(s): PROBNP in the last 8760 hours.  CBG: No results for input(s): GLUCAP in the last 168 hours.     Signed:  Chaya Janstela Hernandez Acosta  [facility] Pager: [phone] 04/03/2018, 12:41  PM

## 2018-04-03 NOTE — Progress Notes (Signed)
*  PRELIMINARY RESULTS* Echocardiogram 2D Echocardiogram has been performed.  Jeryl Columbialliott, Tiah Heckel 04/03/2018, 1:05 PM

## 2018-04-03 NOTE — Evaluation (Signed)
Physical Therapy Evaluation Patient Details Name: Carrie [name] MRN: [id number] DOB: Nov 13, 1975 Today's Date: 04/03/2018   History of Present Illness  Carrie [name] is a 42 y.o. female without significant past medical history who was driving to work at around 6:30 AM when she noticed that her lips started to "feel funny".  She then noticed that the feeling spread to her right cheek and her scalp.  She started touching her cheek and pulling on her hair and noticed a difference between the right and left sides.  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  She subsequently noticed that the numbness spread to her right shoulder and down to her right forearm and fingertips.  At that point the work RN recommended hospital transfer.  Symptoms lasted approximately an hour and 45 minutes before complete resolution.  In the ED her vital signs have been stable, lab work is essentially unremarkable, CT scan of the head is negative for acute changes.  Admission has been requested for further evaluation and management.    Clinical Impression  Patient functioning at baseline for functional mobility and gait.  Plan:  Patient discharged from physical therapy to care of nursing for ambulation daily as tolerated for length of stay.    Follow Up Recommendations No PT follow up    Equipment Recommendations  None recommended by PT    Recommendations for Other Services       Precautions / Restrictions Precautions Precautions: None Restrictions Weight Bearing Restrictions: No      Mobility  Bed Mobility Overal bed mobility: Independent                Transfers Overall transfer level: Independent Equipment used: None                Ambulation/Gait Ambulation/Gait assistance: Independent Gait Distance (Feet): 200 Feet Assistive device: None Gait Pattern/deviations: WFL(Within  Functional Limits) Gait velocity: normal   General Gait Details: normal gait without loss of balance  Stairs Stairs: Yes Stairs assistance: Modified independent (Device/Increase time) Stair Management: One rail Right;One rail Left;Alternating pattern Number of Stairs: 4 General stair comments: demonstrates good return for going up/down 4 steps using 1 siderail without loss of balance  Wheelchair Mobility    Modified Rankin (Stroke Patients Only)       Balance Overall balance assessment: No apparent balance deficits (not formally assessed)                                           Pertinent Vitals/Pain Pain Assessment: No/denies pain    Home Living Family/patient expects to be discharged to:: Private residence Living Arrangements: Spouse/significant other Available Help at Discharge: Family Type of Home: House Home Access: Stairs to enter Entrance Stairs-Rails: Right;Left;Can reach both Secretary/administrator of Steps: 3 Home Layout: One level Home Equipment: None      Prior Function Level of Independence: Independent         Comments: community ambulator, drives, works     Higher education careers adviser        Extremity/Trunk Assessment   Upper Extremity Assessment Upper Extremity Assessment: Overall WFL for tasks assessed    Lower Extremity Assessment Lower Extremity Assessment: Overall WFL for tasks assessed    Cervical / Trunk Assessment Cervical / Trunk Assessment:  Normal  Communication   Communication: No difficulties  Cognition Arousal/Alertness: Awake/alert Behavior During Therapy: WFL for tasks assessed/performed Overall Cognitive Status: Within Functional Limits for tasks assessed                                        General Comments      Exercises     Assessment/Plan    PT Assessment Patent does not need any further PT services  PT Problem List         PT Treatment Interventions      PT Goals (Current  goals can be found in the Care Plan section)  Acute Rehab PT Goals Patient Stated Goal: return home Time For Goal Achievement: 04/03/18 Potential to Achieve Goals: Good    Frequency     Barriers to discharge        Co-evaluation               AM-PAC PT "6 Clicks" Daily Activity  Outcome Measure Difficulty turning over in bed (including adjusting bedclothes, sheets and blankets)?: None Difficulty moving from lying on back to sitting on the side of the bed? : None Difficulty sitting down on and standing up from a chair with arms (e.g., wheelchair, bedside commode, etc,.)?: None Help needed moving to and from a bed to chair (including a wheelchair)?: None Help needed walking in hospital room?: None Help needed climbing 3-5 steps with a railing? : None 6 Click Score: 24    End of Session   Activity Tolerance: Patient tolerated treatment well Patient left: in chair;with call bell/phone within reach Nurse Communication: Mobility status PT Visit Diagnosis: Unsteadiness on feet (R26.81);Other abnormalities of gait and mobility (R26.89);Muscle weakness (generalized) (M62.81)    Time: [id number] PT Time Calculation (min) (ACUTE ONLY): 22 min   Charges:   PT Evaluation $PT Eval Low Complexity: 1 Low PT Treatments $Gait Training: 8-22 mins   PT G Codes:        8:46 AM, 04/03/18 Ocie BobJames Devin Foskey, MPT Physical Therapist with [facility] [phone] office [phone] mobile phone

## 2018-04-03 NOTE — Progress Notes (Signed)
OT Screen Note  Patient Details Name: Janalyn RouseLilibeth Lipsey MRN: [id number] DOB: [birth year]   Cancelled Treatment:    Reason Eval/Treat Not Completed: OT screened, no needs identified, will sign off. Pt functioning at baseline for all ADL which is independent. No strength or coordination concerns for BUE. Patient is safe to return home with no follow up OT services.    Limmie PatriciaLaura Laquanta Hummel, OTR/L,CBIS  [phone]  04/03/2018, 10:08 AM

## 2018-04-03 NOTE — Progress Notes (Signed)
Discharge instructions gone over with patient, verbalized understanding. IV removed, patient tolerated procedure well. Work note and stroke book given to patient.

## 2018-04-03 NOTE — Progress Notes (Signed)
D/t RN assignment confusion, [facility] whom has already received report of patient. Wallace CullensBree, Charge RN aware.

## 2018-04-03 NOTE — Progress Notes (Signed)
SLP Cancellation Note  Patient Details Name: Carrie RouseLilibeth [name] MRN: [id number] DOB: 11-Nov-1975   Cancelled treatment:         SLP screened Pt in room. Pt denies any changes in swallowing, speech, language, or cognition. MRI negative for acute changes.    SLE will be deferred at this time. Reconsult if indicated. SLP will sign off.  Athena MasseAngela Kaye Mitro  M.A., CCC-SLP [email or web address]   Deyana Wnuk W Marijean Montanye 04/03/2018, 1:59 PM

## 2018-04-06 NOTE — ED Provider Notes (Signed)
[facility] Provider Note   CSN: [id number] Arrival date & time: 04/02/18  [id number]     History   Chief Complaint Chief Complaint  Patient presents with  . Facial Numbness    HPI Carrie RouseLilibeth [name] is a 42 y.o. female.  HPI   42 year old female with facial numbness.  Onset around 6:30 AM when she was driving.  She notes that her lips the right cheek were numb.  It felt different when she touches her cheek and pulled her hair.  Symptoms lasted about an hour and have subsided.  No recurrence since then.  No acute pain.  No acute visual changes.  No acute numbness, tingling focal loss of strength.  History reviewed. No pertinent past medical history.  Patient Active Problem List   Diagnosis Date Noted  . TIA (transient ischemic attack) 04/02/2018    Past Surgical History:  Procedure Laterality Date  . APPENDECTOMY    . TUBAL LIGATION       OB History   None      Home Medications    Prior to Admission medications   Medication Sig Start Date End Date Taking? Authorizing Provider  Multiple Vitamins-Minerals (ALIVE WOMENS ENERGY PO) Take 1 tablet by mouth daily.   Yes [provider]  aspirin 81 MG chewable tablet Chew 1 tablet (81 mg total) by mouth daily. 04/04/18   Philip AspenHernandez Acosta, Limmie PatriciaEstela Y, MD  simvastatin (ZOCOR) 20 MG tablet Take 1 tablet (20 mg total) by mouth daily at 6 PM. 04/03/18   Philip AspenHernandez Acosta, Limmie PatriciaEstela Y, MD    Family History History reviewed. No pertinent family history.  Social History Social History   Tobacco Use  . Smoking status: Never Smoker  . Smokeless tobacco: Never Used  Substance Use Topics  . Alcohol use: No  . Drug use: No     Allergies   Shellfish allergy and Ciprofloxacin   Review of Systems Review of Systems  All systems reviewed and negative, other than as noted in HPI.  Physical Exam Updated Vital Signs BP 130/80 (BP Location: Left Arm)   Pulse 72   Temp 97.7 F (36.5 C) (Oral)   Resp  18   Ht 5\' 1"  (1.549 m)   Wt 73.1 kg (161 lb 2.5 oz)   LMP 03/26/2018   SpO2 100%   BMI 30.45 kg/m   Physical Exam  Constitutional: She is oriented to person, place, and time. She appears well-developed and well-nourished. No distress.  HENT:  Head: Normocephalic and atraumatic.  Eyes: Conjunctivae are normal. Right eye exhibits no discharge. Left eye exhibits no discharge.  Neck: Neck supple.  Cardiovascular: Normal rate, regular rhythm and normal heart sounds. Exam reveals no gallop and no friction rub.  No murmur heard. Pulmonary/Chest: Effort normal and breath sounds normal. No respiratory distress.  Abdominal: Soft. She exhibits no distension. There is no tenderness.  Musculoskeletal: She exhibits no edema or tenderness.  Neurological: She is alert and oriented to person, place, and time. No cranial nerve deficit. She exhibits normal muscle tone. Coordination normal.  Skin: Skin is warm and dry.  Psychiatric: She has a normal mood and affect. Her behavior is normal. Thought content normal.  Nursing note and vitals reviewed.    ED Treatments / Results  Labs (all labs ordered are listed, but only abnormal results are displayed) Labs Reviewed  LIPID PANEL - Abnormal; Notable for the following components:      Result Value   LDL Cholesterol 102 (*)  All other components within normal limits  CBC WITH DIFFERENTIAL/PLATELET  BASIC METABOLIC PANEL  HIV ANTIBODY (ROUTINE TESTING)  HEMOGLOBIN A1C  I-STAT BETA HCG BLOOD, ED ([facility], [facility], [facility] ONLY)    EKG EKG Interpretation  Date/Time:  Thursday April 02 2018 09:29:53 EDT Ventricular Rate:  75 PR Interval:    QRS Duration: 96 QT Interval:  390 QTC Calculation: 436 R Axis:   59 Text Interpretation:  Sinus rhythm Low voltage, precordial leads Confirmed by Raeford Razor [phone]) on 04/02/2018 10:51:57 AM   Radiology No results found.  Procedures Procedures (including critical care time)  Medications Ordered in  ED Medications   stroke: mapping our early stages of recovery book ( Does not apply Given 04/03/18 0937)     Initial Impression / Assessment and Plan / ED Course  I have reviewed the triage vital signs and the nursing notes.  Pertinent labs & imaging results that were available during my care of the patient were reviewed by me and considered in my medical decision making (see chart for details).     42 year old female with facial numbness which has since resolved.  Neuro exam is nonfocal.  CT the head without acute abnormality.  Will admit for TIA work-up.  Final Clinical Impressions(s) / ED Diagnoses   Final diagnoses:  TIA (transient ischemic attack)    ED Discharge Orders        Ordered    aspirin 81 MG chewable tablet  Daily     04/03/18 1241    simvastatin (ZOCOR) 20 MG tablet  Daily-1800     04/03/18 1241    Increase activity slowly     04/03/18 1241    Diet - low sodium heart healthy     04/03/18 1241       Raeford Razor, MD 04/06/18 1503

## 2018-04-09 ENCOUNTER — Ambulatory Visit: Payer: BLUE CROSS/BLUE SHIELD | Admitting: General Surgery

## 2018-04-09 ENCOUNTER — Encounter: Payer: Self-pay | Admitting: General Surgery

## 2018-04-09 VITALS — BP 158/80 | HR 77 | Temp 97.8°F | Resp 18 | Wt 161.0 lb

## 2018-04-09 DIAGNOSIS — R1011 Right upper quadrant pain: Secondary | ICD-10-CM

## 2018-04-09 NOTE — Patient Instructions (Signed)
See Dr. Ouida SillsFagan after mini stroke. Get HIDA scan (see below for information). You may have Biliary Dyskinesia which is your gallbladder not squeezing/ pumping well and the HIDA scan will help diagnose this problem.   Gallbladder Nuclear Scan A gallbladder nuclear scan (hepatobiliary scan or HIDA scan) is an imaging test that checks the function of your liver, your gallbladder, and the ducts of those organs. These parts make up your hepatobiliary system. You may need this scan if you have symptoms of liver or gallbladder disease. This scan is done with a camera that detects radioactive energy (gamma rays). For this exam, you will be given a radioactive substance, called a radiotracer, through an IV tube inserted in your hand or arm. As the radiotracer moves through your hepatobiliary system, the camera and a computer detect the gamma rays and form them into images that show how well your system is working. Tell a health care provider about:  Any possibility of pregnancy or if you are breastfeeding.  Any allergies you have.  All medicines you are taking, including vitamins, herbs, eye drops, creams, and over-the-counter medicines.  Any problems you or family members have had with anesthetic medicines.  Any blood disorders you have.  Any surgeries you have had.  Any medical conditions you have. What happens before the procedure?  Take medicines only as directed by your health care provider.  Your health care provider will let you know when you should start fasting. You may not be able to eat or drink anything after midnight on the night before the procedure or for at least four hours before the test.  Do not wear jewelry to the exam.  Wear loose, comfortable clothing. You may be asked to put on a gown for the procedure. What happens during the procedure?  An IV tube will be inserted into a vein in your hand or arm. It will remain in place throughout the exam.  A small amount of the  radiotracer material will be injected through your IV tube.  You may feel a cold sensation as the material runs through your IV tube.  While you are lying down, a technician will place the gamma camera over your abdomen.  The camera may rotate around your body. You may be asked to stay still or move into a certain position.  You will then be given a medicine called cholecystokinin (CCK) through your IV tube. This will make your gallbladder empty. You may feel nauseous or have cramping for a short time.  Additional images will be taken after CCK is given.  After all the images have been taken, your IV tube will be removed. What happens after the procedure?  You may resume normal activities and diet as directed by your health care provider.  The radiotracer will leave your body over the next few days. There are no long-term side effects from the radiotracer. Drink lots of fluids to help flush it out of your body.  A health care provider who specializes in nuclear medicine will read the scan and send a report to your regular health care provider. This information is not intended to replace advice given to you by your health care provider. Make sure you discuss any questions you have with your health care provider. Document Released: 09/06/2000 Document Revised: 02/15/2016 Document Reviewed: 01/12/2014 Elsevier Interactive Patient Education  [employer]2018 Elsevier Inc.

## 2018-04-09 NOTE — Progress Notes (Signed)
[facility] History and Physical  Reason for Referral: RUQ pain  Referring Physician:  Dr. Ouida Sills   Chief Complaint    Abdominal Pain      Carrie [name] is a 42 y.o. female.  HPI: Carrie [name] is a 42 yo has been having RUQ pain for at least 1 year.  She underwent an [place] 09/2016 and this showed no stones.  She continues to have some epigastric/ RUQ and right flank pain that occurs with some food intake, and large /fatty meals.  She denies any nausea/vomiting. She denies any excessive NSAID use.  She was recently admitted overnight for numbness in the right face that was diagnosed as a TIA due to HTN.  She was started on aspirin and cholesterol medications, but is not on any BP medication.    She underwent MRI, carotid doppler and ECHO and these were all relatively normal.  She has follow up with Dr. Ouida Sills July 30 regarding this admission.  She already had this appointment to see me today.    Past Medical History:  Diagnosis Date  . TIA (transient ischemic attack) 03/2018    Past Surgical History:  Procedure Laterality Date  . APPENDECTOMY    . TUBAL LIGATION      Family History  Problem Relation Age of Onset  . Hypertension Mother   . Colon cancer Mother   . Hypertension Father     Social History   Tobacco Use  . Smoking status: Never Smoker  . Smokeless tobacco: Never Used  Substance Use Topics  . Alcohol use: No  . Drug use: No    Medications: I have reviewed the patient's current medications. Allergies as of 04/09/2018      Reactions   Shellfish Allergy Anaphylaxis, Swelling   Ciprofloxacin Hives      Medication List        Accurate as of 04/09/18  1:36 PM. Always use your most recent med list.          ALIVE WOMENS ENERGY PO Take 1 tablet by mouth daily.   aspirin 81 MG chewable tablet Chew 1 tablet (81 mg total) by mouth daily.   simvastatin 20 MG tablet Commonly known as:  ZOCOR Take 1 tablet (20 mg total) by mouth daily at 6  PM.        ROS:  A comprehensive review of systems was negative except for: Gastrointestinal: positive for abdominal pain Musculoskeletal: positive for back pain Neurological: positive for headaches  Blood pressure (!) 158/80, pulse 77, temperature 97.8 F (36.6 C), temperature source Temporal, resp. rate 18, weight 161 lb (73 kg), last menstrual period 03/26/2018. Physical Exam  Constitutional: She is oriented to person, place, and time. She appears well-developed and well-nourished.  HENT:  Head: Normocephalic and atraumatic.  Eyes: Pupils are equal, round, and reactive to light.  Cardiovascular: Normal rate and regular rhythm.  Pulmonary/Chest: Effort normal.  Abdominal: Soft. Normal appearance. She exhibits no mass. There is tenderness in the right upper quadrant. There is no rigidity and no guarding. No hernia.  Musculoskeletal: Normal range of motion.  No edema  Neurological: She is alert and oriented to person, place, and time.  Skin: Skin is warm and dry.  Psychiatric: She has a normal mood and affect. Her behavior is normal.  Vitals reviewed.   Results: Carotid Doppler 03/2018 CLINICAL DATA:  Right-sided facial paresthesias, upper extremity numbness, blurred vision  EXAM: BILATERAL CAROTID DUPLEX ULTRASOUND  TECHNIQUE: Wallace Cullens scale imaging, color Doppler and duplex  ultrasound were performed of bilateral carotid and vertebral arteries in the neck.  COMPARISON:  04/02/2018  FINDINGS: Criteria: Quantification of carotid stenosis is based on velocity parameters that correlate the residual internal carotid diameter with NASCET-based stenosis levels, using the diameter of the distal internal carotid lumen as the denominator for stenosis measurement.  The following velocity measurements were obtained:  RIGHT ICA: 99/44 cm/sec CCA: 85/24 cm/sec  SYSTOLIC ICA/CCA RATIO:  1.2  ECA:  86 cm/sec  LEFT  ICA: 90/41 cm/sec  CCA: 90/28  cm/sec  SYSTOLIC ICA/CCA RATIO:  1.0  ECA:  60 cm/sec  RIGHT CAROTID ARTERY: Minor intimal thickening without significant atherosclerosis. No hemodynamically significant right ICA stenosis, velocity elevation, or turbulent flow. Degree of narrowing less than 50%.  RIGHT VERTEBRAL ARTERY:  Antegrade  LEFT CAROTID ARTERY: Similar minor intimal thickening without significant atherosclerosis. No hemodynamically significant left ICA stenosis, velocity elevation, or turbulent flow.  LEFT VERTEBRAL ARTERY:  Antegrade  IMPRESSION: Minor carotid intimal thickening. No significant atherosclerosis or ICA stenosis by ultrasound. Degree of narrowing less than 50% bilaterally by ultrasound criteria.  Patent antegrade vertebral flow bilaterally  ECHO 03/2018 Study Conclusions  - Left ventricle: The cavity size was normal. Wall thickness was   normal. Systolic function was normal. The estimated ejection   fraction was in the range of 50% to 55%. Wall motion was normal;   there were no regional wall motion abnormalities. Left   ventricular diastolic function parameters were normal. - Aortic valve: Valve area (VTI): 2.85 cm^2. Valve area (Vmax):   2.62 cm^2. - Technically adequate study  MRI- 03/2018 CLINICAL DATA:  42 y/o F; right-sided facial and right arm numbness with blurry vision, now resolved.  EXAM: MRI HEAD WITHOUT CONTRAST  TECHNIQUE: Multiplanar, multiecho pulse sequences of the brain and surrounding structures were obtained without intravenous contrast.  COMPARISON:  04/02/2018 CT head.  FINDINGS: Brain: No acute infarction, hemorrhage, hydrocephalus, extra-axial collection or mass lesion.  Vascular: Normal flow voids.  Skull and upper cervical spine: Normal marrow signal.  Sinuses/Orbits: Mild ethmoid sinus mucosal thickening. No abnormal signal of mastoid air cells. Orbits are unremarkable.  Other: None.  IMPRESSION: No acute intracranial  abnormality.  Unremarkable MRI of the brain.  US RUQ 09/2016 IMPRESSION: No gallstones or ultrasound evidence of cholecystitis.  Common bile duct slightly prominent at 4.4 mm (upper limits of normal for this age typically 4 mm). No common bile duct stone visualized (distal aspect of common bile duct not adequately assessed secondary to bowel gas).  Difficult to penetrate the liver without liver mass or intrahepatic biliary duct dilation noted.  Assessment & Plan:  Janalyn RouseLilibeth Spilker is a 42 y.o. female with diagnosis of a recent TIA thought to be from HTN and cholesterol, who was sent home on a statin and aspirin. No BP meds were prescribed. She was instructed to take in a low sodium diet.  She also is having RUQ pain that sounds like it can be biliary dyskinesia, but this has yet to be diagnosed.  - Recommend HIDA scan, will have Dr. Karleen HampshireFagans office order this, a Epic note was sent to him  - Dr. Ouida SillsFagan also seeing her after her TIA - Once seen by him and HIDA done, can schedule for lap chole   PLAN: I counseled the patient about the indication, risks and benefits of laparoscopic cholecystectomy.  She understands there is a very small chance for bleeding, infection, injury to normal structures (including common bile duct), conversion to open surgery, persistent  symptoms, evolution of postcholecystectomy diarrhea, need for secondary interventions, anesthesia reaction, cardiopulmonary issues and other risks not specifically detailed here. I described the expected recovery, the plan for follow-up and the restrictions during the recovery phase.  All questions were answered.  Lucretia Roers 04/09/2018, 1:36 PM

## 2018-04-13 ENCOUNTER — Other Ambulatory Visit (HOSPITAL_COMMUNITY): Payer: Self-pay | Admitting: Internal Medicine

## 2018-04-13 DIAGNOSIS — R1011 Right upper quadrant pain: Secondary | ICD-10-CM

## 2018-04-16 ENCOUNTER — Ambulatory Visit (HOSPITAL_COMMUNITY): Payer: BLUE CROSS/BLUE SHIELD

## 2018-04-16 ENCOUNTER — Encounter (HOSPITAL_COMMUNITY): Payer: Self-pay

## 2018-04-21 DIAGNOSIS — R1011 Right upper quadrant pain: Secondary | ICD-10-CM | POA: Diagnosis not present

## 2018-04-21 DIAGNOSIS — G459 Transient cerebral ischemic attack, unspecified: Secondary | ICD-10-CM | POA: Diagnosis not present

## 2018-05-06 ENCOUNTER — Encounter (HOSPITAL_COMMUNITY)
Admission: RE | Admit: 2018-05-06 | Discharge: 2018-05-06 | Disposition: A | Discharge status: Home / Self Care | Payer: BLUE CROSS/BLUE SHIELD | Source: Ambulatory Visit | Attending: Internal Medicine | Admitting: Internal Medicine

## 2018-05-06 DIAGNOSIS — R1011 Right upper quadrant pain: Secondary | ICD-10-CM

## 2018-05-06 MED ORDER — TECHNETIUM TC 99M MEBROFENIN IV KIT
5.0000 | PACK | Freq: Once | INTRAVENOUS | Status: AC | PRN
  Administered 2018-05-06: 5 via INTRAVENOUS

## 2018-06-06 DIAGNOSIS — Z79899 Other long term (current) drug therapy: Secondary | ICD-10-CM | POA: Diagnosis not present

## 2018-06-06 DIAGNOSIS — G459 Transient cerebral ischemic attack, unspecified: Secondary | ICD-10-CM | POA: Diagnosis not present

## 2018-06-15 DIAGNOSIS — Z23 Encounter for immunization: Secondary | ICD-10-CM | POA: Diagnosis not present

## 2018-06-15 DIAGNOSIS — Z8673 Personal history of transient ischemic attack (TIA), and cerebral infarction without residual deficits: Secondary | ICD-10-CM | POA: Diagnosis not present

## 2018-06-15 DIAGNOSIS — R1011 Right upper quadrant pain: Secondary | ICD-10-CM | POA: Diagnosis not present

## 2018-07-21 ENCOUNTER — Ambulatory Visit: Payer: Self-pay | Admitting: General Surgery

## 2018-07-21 ENCOUNTER — Ambulatory Visit: Payer: BLUE CROSS/BLUE SHIELD | Admitting: General Surgery

## 2018-07-21 ENCOUNTER — Encounter: Payer: Self-pay | Admitting: General Surgery

## 2018-07-21 VITALS — BP 126/82 | HR 68 | Temp 97.5°F | Resp 16 | Wt 157.0 lb

## 2018-07-21 DIAGNOSIS — K828 Other specified diseases of gallbladder: Secondary | ICD-10-CM

## 2018-07-21 NOTE — Progress Notes (Signed)
[facility] History and Physical  Reason for Referral: Gallbladder  Referring Physician: Dr. Lajuana [name] Carrie a 42 y.o. female.  HPI: Carrie [name] Carrie a 42 yo known to me who was seen back July 2019 with complaints of right sided pain that went into her back that was especially worse with fatty meals. She had this pain for over 1 year but a [place] was negative for any stones. She also had a recent admission for possible TIA and was being followed up for that with Dr. Ouida Sills. During her admission for her possible TIA she underwent an MRI, carotid doppler and ECHO all of which were relatively normal.  She reports she has been doing fair since that time, but still having the right sided/ back pain that Carrie consistent with fatty large meals. She denies any nausea/vomiting. She also denies any further symptoms of stroke/ TIA and says she has her BP under control.    She had a HIDA scan that had a normal EF but there was reproduction of the pain with Ensure intake.  Past Medical History:  Diagnosis Date  . TIA (transient ischemic attack) 03/2018    Past Surgical History:  Procedure Laterality Date  . APPENDECTOMY    . TUBAL LIGATION      Family History  Problem Relation Age of Onset  . Hypertension Mother   . Colon cancer Mother   . Hypertension Father     Social History   Tobacco Use  . Smoking status: Never Smoker  . Smokeless tobacco: Never Used  Substance Use Topics  . Alcohol use: No  . Drug use: No    Medications: I have reviewed the patient's current medications. Allergies as of 07/21/2018      Reactions   Shellfish Allergy Anaphylaxis, Swelling   Ciprofloxacin Hives      Medication List        Accurate as of 07/21/18 10:30 AM. Always use your most recent med list.          ALIVE WOMENS ENERGY PO Take 1 tablet by mouth daily.   aspirin 81 MG chewable tablet Chew 1 tablet (81 mg total) by mouth daily.   simvastatin 20 MG  tablet Commonly known as:  ZOCOR Take 1 tablet (20 mg total) by mouth daily at 6 PM.        ROS:  A comprehensive review of systems was negative except for: Gastrointestinal: positive for abdominal pain and right sided/ back pain  Blood pressure 126/82, pulse 68, temperature (!) 97.5 F (36.4 C), temperature source Temporal, resp. rate 16, weight 157 lb (71.2 kg). Physical Exam  Constitutional: She Carrie oriented to person, place, and time. She appears well-developed.  HENT:  Head: Normocephalic and atraumatic.  Eyes: Pupils are equal, round, and reactive to light. EOM are normal.  Neck: Normal range of motion. Neck supple.  Cardiovascular: Normal rate and regular rhythm.  Pulmonary/Chest: Effort normal and breath sounds normal.  Abdominal: Soft. She exhibits no distension and no mass. There Carrie tenderness in the right upper quadrant.  Musculoskeletal: Normal range of motion. She exhibits no edema.  Neurological: She Carrie alert and oriented to person, place, and time.  Skin: Skin Carrie warm and dry.  Psychiatric: She has a normal mood and affect. Her behavior Carrie normal. Judgment and thought content normal.  Vitals reviewed.   Results: [place] RUQ 09/2016 no stones or cholecystitis, CBD 4.4 mm HIDA 04/2018 IMPRESSION: Normal ejection fraction of radiotracer  from the gallbladder. Patient did experience clinical symptoms with the oral Ensure consumption. Cystic and common bile ducts are patent as Carrie evidenced by visualization of gallbladder and small bowel.   Assessment & Plan:  Carrie [name] Carrie a 42 y.o. 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 She does have normal function but Carrie having pain that Carrie reproduced with the exam. She continues to have this pain.  She otherwise Carrie doing well without issues or symptoms of unilateral weakness/ facial drooping/ chest pain or SOB.   -Lap chole possible open in the near future   All questions were answered to the  satisfaction of the patient and family.  PLAN: I counseled the patient about the indication, risks and benefits of laparoscopic cholecystectomy.  She understands there Carrie a very small chance for bleeding, infection, injury to normal structures (including common bile duct), conversion to open surgery, persistent symptoms, evolution of postcholecystectomy diarrhea, need for secondary interventions, anesthesia reaction, cardiopulmonary issues and other risks not specifically detailed here. I described the expected recovery, the plan for follow-up and the restrictions during the recovery phase.  All questions were answered.   Carrie Roers 07/21/2018, 10:30 AM

## 2018-07-21 NOTE — H&P (Signed)
[facility] History and Physical  Reason for Referral: Gallbladder  Referring Physician: Dr. Fagan     Carrie [name] is a 42 y.o. female.  HPI: Carrie [name] is a 42 yo known to me who was seen back July 2019 with complaints of right sided pain that went into her back that was especially worse with fatty meals. She had this pain for over 1 year but a US was negative for any stones. She also had a recent admission for possible TIA and was being followed up for that with Dr. Fagan. During her admission for her possible TIA she underwent an MRI, carotid doppler and ECHO all of which were relatively normal.  She reports she has been doing fair since that time, but still having the right sided/ back pain that is consistent with fatty large meals. She denies any nausea/vomiting. She also denies any further symptoms of stroke/ TIA and says she has her BP under control.    She had a HIDA scan that had a normal EF but there was reproduction of the pain with Ensure intake.  Past Medical History:  Diagnosis Date  . TIA (transient ischemic attack) 03/2018    Past Surgical History:  Procedure Laterality Date  . APPENDECTOMY    . TUBAL LIGATION      Family History  Problem Relation Age of Onset  . Hypertension Mother   . Colon cancer Mother   . Hypertension Father     Social History   Tobacco Use  . Smoking status: Never Smoker  . Smokeless tobacco: Never Used  Substance Use Topics  . Alcohol use: No  . Drug use: No    Medications: I have reviewed the patient's current medications. Allergies as of 07/21/2018      Reactions   Shellfish Allergy Anaphylaxis, Swelling   Ciprofloxacin Hives      Medication List        Accurate as of 07/21/18 10:30 AM. Always use your most recent med list.          ALIVE WOMENS ENERGY PO Take 1 tablet by mouth daily.   aspirin 81 MG chewable tablet Chew 1 tablet (81 mg total) by mouth daily.   simvastatin 20 MG  tablet Commonly known as:  ZOCOR Take 1 tablet (20 mg total) by mouth daily at 6 PM.        ROS:  A comprehensive review of systems was negative except for: Gastrointestinal: positive for abdominal pain and right sided/ back pain  Blood pressure 126/82, pulse 68, temperature (!) 97.5 F (36.4 C), temperature source Temporal, resp. rate 16, weight 157 lb (71.2 kg). Physical Exam  Constitutional: She is oriented to person, place, and time. She appears well-developed.  HENT:  Head: Normocephalic and atraumatic.  Eyes: Pupils are equal, round, and reactive to light. EOM are normal.  Neck: Normal range of motion. Neck supple.  Cardiovascular: Normal rate and regular rhythm.  Pulmonary/Chest: Effort normal and breath sounds normal.  Abdominal: Soft. She exhibits no distension and no mass. There is tenderness in the right upper quadrant.  Musculoskeletal: Normal range of motion. She exhibits no edema.  Neurological: She is alert and oriented to person, place, and time.  Skin: Skin is warm and dry.  Psychiatric: She has a normal mood and affect. Her behavior is normal. Judgment and thought content normal.  Vitals reviewed.   Results: US RUQ 09/2016 no stones or cholecystitis, CBD 4.4 mm HIDA 04/2018 IMPRESSION: Normal ejection fraction of radiotracer   from the gallbladder. Patient did experience clinical symptoms with the oral Ensure consumption. Cystic and common bile ducts are patent as is evidenced by visualization of gallbladder and small bowel.   Assessment & Plan:  Carrie [name] is a 42 y.o. 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 She does have normal function but is having pain that is reproduced with the exam. She continues to have this pain.  She otherwise is doing well without issues or symptoms of unilateral weakness/ facial drooping/ chest pain or SOB.   -Lap chole possible open in the near future   All questions were answered to the  satisfaction of the patient and family.  PLAN: I counseled the patient about the indication, risks and benefits of laparoscopic cholecystectomy.  She understands there is a very small chance for bleeding, infection, injury to normal structures (including common bile duct), conversion to open surgery, persistent symptoms, evolution of postcholecystectomy diarrhea, need for secondary interventions, anesthesia reaction, cardiopulmonary issues and other risks not specifically detailed here. I described the expected recovery, the plan for follow-up and the restrictions during the recovery phase.  All questions were answered.   Ahmiya Abee C Elizebeth Kluesner 07/21/2018, 10:30 AM       

## 2018-08-06 NOTE — Patient Instructions (Signed)
Carrie [name]  08/06/2018     @PREFPERIOPPHARMACY @   Your procedure is scheduled on  08/12/2018 .  Report to [facility] at  615  A.M.  Call this number if you have problems the morning of surgery:  [phone]   Remember:  Do not eat or drink after midnight.                       Take these medicines the morning of surgery with A SIP OF WATER None    Do not wear jewelry, make-up or nail polish.  Do not wear lotions, powders, or perfumes, or deodorant.  Do not shave 48 hours prior to surgery.  Men may shave face and neck.  Do not bring valuables to the hospital.  [facility] is not responsible for any belongings or valuables.  Contacts, dentures or bridgework may not be worn into surgery.  Leave your suitcase in the car.  After surgery it may be brought to your room.  For patients admitted to the hospital, discharge time will be determined by your treatment team.  Patients discharged the day of surgery will not be allowed to drive home.   Name and phone number of your driver:   family Special instructions:  None  Please read over the following fact sheets that you were given. Anesthesia Post-op Instructions and Care and Recovery After Surgery       Laparoscopic Cholecystectomy Laparoscopic cholecystectomy is surgery to remove the gallbladder. The gallbladder is a pear-shaped organ that lies beneath the liver on the right side of the body. The gallbladder stores bile, which is a fluid that helps the body to digest fats. Cholecystectomy is often done for inflammation of the gallbladder (cholecystitis). This condition is usually caused by a buildup of gallstones (cholelithiasis) in the gallbladder. Gallstones can block the flow of bile, which can result in inflammation and pain. In severe cases, emergency surgery may be required. This procedure is done though small incisions in your abdomen (laparoscopic surgery). A thin scope with a camera (laparoscope)  is inserted through one incision. Thin surgical instruments are inserted through the other incisions. In some cases, a laparoscopic procedure may be turned into a type of surgery that is done through a larger incision (open surgery). Tell a health care provider about:  Any allergies you have.  All medicines you are taking, including vitamins, herbs, eye drops, creams, and over-the-counter medicines.  Any problems you or family members have had with anesthetic medicines.  Any blood disorders you have.  Any surgeries you have had.  Any medical conditions you have.  Whether you are pregnant or may be pregnant. What are the risks? Generally, this is a safe procedure. However, problems may occur, including:  Infection.  Bleeding.  Allergic reactions to medicines.  Damage to other structures or organs.  A stone remaining in the common bile duct. The common bile duct carries bile from the gallbladder into the small intestine.  A bile leak from the cyst duct that is clipped when your gallbladder is removed.  What happens before the procedure? Staying hydrated Follow instructions from your health care provider about hydration, which may include:  Up to 2 hours before the procedure - you may continue to drink clear liquids, such as water, clear fruit juice, black coffee, and plain tea.  Eating and drinking restrictions Follow instructions from your health care provider about eating and drinking, which  may include:  8 hours before the procedure - stop eating heavy meals or foods such as meat, fried foods, or fatty foods.  6 hours before the procedure - stop eating light meals or foods, such as toast or cereal.  6 hours before the procedure - stop drinking milk or drinks that contain milk.  2 hours before the procedure - stop drinking clear liquids.  Medicines  Ask your health care provider about: ? Changing or stopping your regular medicines. This is especially important if  you are taking diabetes medicines or blood thinners. ? Taking medicines such as aspirin and ibuprofen. These medicines can thin your blood. Do not take these medicines before your procedure if your health care provider instructs you not to.  You may be given antibiotic medicine to help prevent infection. General instructions  Let your health care provider know if you develop a cold or an infection before surgery.  Plan to have someone take you home from the hospital or clinic.  Ask your health care provider how your surgical site will be marked or identified. What happens during the procedure?  To reduce your risk of infection: ? Your health care team will wash or sanitize their hands. ? Your skin will be washed with soap. ? Hair may be removed from the surgical area.  An IV tube may be inserted into one of your veins.  You will be given one or more of the following: ? A medicine to help you relax (sedative). ? A medicine to make you fall asleep (general anesthetic).  A breathing tube will be placed in your mouth.  Your surgeon will make several small cuts (incisions) in your abdomen.  The laparoscope will be inserted through one of the small incisions. The camera on the laparoscope will send images to a TV screen (monitor) in the operating room. This lets your surgeon see inside your abdomen.  Air-like gas will be pumped into your abdomen. This will expand your abdomen to give the surgeon more room to perform the surgery.  Other tools that are needed for the procedure will be inserted through the other incisions. The gallbladder will be removed through one of the incisions.  Your common bile duct may be examined. If stones are found in the common bile duct, they may be removed.  After your gallbladder has been removed, the incisions will be closed with stitches (sutures), staples, or skin glue.  Your incisions may be covered with a bandage (dressing). The procedure may vary  among health care providers and hospitals. What happens after the procedure?  Your blood pressure, heart rate, breathing rate, and blood oxygen level will be monitored until the medicines you were given have worn off.  You will be given medicines as needed to control your pain.  Do not drive for 24 hours if you were given a sedative. This information is not intended to replace advice given to you by your health care provider. Make sure you discuss any questions you have with your health care provider. Document Released: 09/09/2005 Document Revised: 03/31/2016 Document Reviewed: 02/26/2016 Elsevier Interactive Patient Education  2018 [employer].  Laparoscopic Cholecystectomy, Care After This sheet gives you information about how to care for yourself after your procedure. Your health care provider may also give you more specific instructions. If you have problems or questions, contact your health care provider. What can I expect after the procedure? After the procedure, it is common to have:  Pain at your incision sites. You  will be given medicines to control this pain.  Mild nausea or vomiting.  Bloating and possible shoulder pain from the air-like gas that was used during the procedure.  Follow these instructions at home: Incision care   Follow instructions from your health care provider about how to take care of your incisions. Make sure you: ? Wash your hands with soap and water before you change your bandage (dressing). If soap and water are not available, use hand sanitizer. ? Change your dressing as told by your health care provider. ? Leave stitches (sutures), skin glue, or adhesive strips in place. These skin closures may need to be in place for 2 weeks or longer. If adhesive strip edges start to loosen and curl up, you may trim the loose edges. Do not remove adhesive strips completely unless your health care provider tells you to do that.  Do not take baths, swim, or use a  hot tub until your health care provider approves. Ask your health care provider if you can take showers. You may only be allowed to take sponge baths for bathing.  Check your incision area every day for signs of infection. Check for: ? More redness, swelling, or pain. ? More fluid or blood. ? Warmth. ? Pus or a bad smell. Activity  Do not drive or use heavy machinery while taking prescription pain medicine.  Do not lift anything that is heavier than 10 lb (4.5 kg) until your health care provider approves.  Do not play contact sports until your health care provider approves.  Do not drive for 24 hours if you were given a medicine to help you relax (sedative).  Rest as needed. Do not return to work or school until your health care provider approves. General instructions  Take over-the-counter and prescription medicines only as told by your health care provider.  To prevent or treat constipation while you are taking prescription pain medicine, your health care provider may recommend that you: ? Drink enough fluid to keep your urine clear or pale yellow. ? Take over-the-counter or prescription medicines. ? Eat foods that are high in fiber, such as fresh fruits and vegetables, whole grains, and beans. ? Limit foods that are high in fat and processed sugars, such as fried and sweet foods. Contact a health care provider if:  You develop a rash.  You have more redness, swelling, or pain around your incisions.  You have more fluid or blood coming from your incisions.  Your incisions feel warm to the touch.  You have pus or a bad smell coming from your incisions.  You have a fever.  One or more of your incisions breaks open. Get help right away if:  You have trouble breathing.  You have chest pain.  You have increasing pain in your shoulders.  You faint or feel dizzy when you stand.  You have severe pain in your abdomen.  You have nausea or vomiting that lasts for more  than one day.  You have leg pain. This information is not intended to replace advice given to you by your health care provider. Make sure you discuss any questions you have with your health care provider. Document Released: 09/09/2005 Document Revised: 03/30/2016 Document Reviewed: 02/26/2016 Elsevier Interactive Patient Education  2018 [employer]  General Anesthesia, Adult General anesthesia is the use of medicines to make a person "go to sleep" (be unconscious) for a medical procedure. General anesthesia is often recommended when a procedure:  Is long.  Requires you  to be still or in an unusual position.  Is major and can cause you to lose blood.  Is impossible to do without general anesthesia.  The medicines used for general anesthesia are called general anesthetics. In addition to making you sleep, the medicines:  Prevent pain.  Control your blood pressure.  Relax your muscles.  Tell a health care provider about:  Any allergies you have.  All medicines you are taking, including vitamins, herbs, eye drops, creams, and over-the-counter medicines.  Any problems you or family members have had with anesthetic medicines.  Types of anesthetics you have had in the past.  Any bleeding disorders you have.  Any surgeries you have had.  Any medical conditions you have.  Any history of heart or lung conditions, such as heart failure, sleep apnea, or chronic obstructive pulmonary disease (COPD).  Whether you are pregnant or may be pregnant.  Whether you use tobacco, alcohol, marijuana, or street drugs.  Any history of Financial planner.  Any history of depression or anxiety. What are the risks? Generally, this is a safe procedure. However, problems may occur, including:  Allergic reaction to anesthetics.  Lung and heart problems.  Inhaling food or liquids from your stomach into your lungs (aspiration).  Injury to nerves.  Waking up during your procedure and  being unable to move (rare).  Extreme agitation or a state of mental confusion (delirium) when you wake up from the anesthetic.  Air in the bloodstream, which can lead to stroke.  These problems are more likely to develop if you are having a major surgery or if you have an advanced medical condition. You can prevent some of these complications by answering all of your health care provider's questions thoroughly and by following all pre-procedure instructions. General anesthesia can cause side effects, including:  Nausea or vomiting  A sore throat from the breathing tube.  Feeling cold or shivery.  Feeling tired, washed out, or achy.  Sleepiness or drowsiness.  Confusion or agitation.  What happens before the procedure? Staying hydrated Follow instructions from your health care provider about hydration, which may include:  Up to 2 hours before the procedure - you may continue to drink clear liquids, such as water, clear fruit juice, black coffee, and plain tea.  Eating and drinking restrictions Follow instructions from your health care provider about eating and drinking, which may include:  8 hours before the procedure - stop eating heavy meals or foods such as meat, fried foods, or fatty foods.  6 hours before the procedure - stop eating light meals or foods, such as toast or cereal.  6 hours before the procedure - stop drinking milk or drinks that contain milk.  2 hours before the procedure - stop drinking clear liquids.  Medicines  Ask your health care provider about: ? Changing or stopping your regular medicines. This is especially important if you are taking diabetes medicines or blood thinners. ? Taking medicines such as aspirin and ibuprofen. These medicines can thin your blood. Do not take these medicines before your procedure if your health care provider instructs you not to. ? Taking new dietary supplements or medicines. Do not take these during the week before  your procedure unless your health care provider approves them.  If you are told to take a medicine or to continue taking a medicine on the day of the procedure, take the medicine with sips of water. General instructions   Ask if you will be going home the same day,  the following day, or after a longer hospital stay. ? Plan to have someone take you home. ? Plan to have someone stay with you for the first 24 hours after you leave the hospital or clinic.  For 3-6 weeks before the procedure, try not to use any tobacco products, such as cigarettes, chewing tobacco, and e-cigarettes.  You may brush your teeth on the morning of the procedure, but make sure to spit out the toothpaste. What happens during the procedure?  You will be given anesthetics through a mask and through an IV tube in one of your veins.  You may receive medicine to help you relax (sedative).  As soon as you are asleep, a breathing tube may be used to help you breathe.  An anesthesia specialist will stay with you throughout the procedure. He or she will help keep you comfortable and safe by continuing to give you medicines and adjusting the amount of medicine that you get. He or she will also watch your blood pressure, pulse, and oxygen levels to make sure that the anesthetics do not cause any problems.  If a breathing tube was used to help you breathe, it will be removed before you wake up. The procedure may vary among health care providers and hospitals. What happens after the procedure?  You will wake up, often slowly, after the procedure is complete, usually in a recovery area.  Your blood pressure, heart rate, breathing rate, and blood oxygen level will be monitored until the medicines you were given have worn off.  You may be given medicine to help you calm down if you feel anxious or agitated.  If you will be going home the same day, your health care provider may check to make sure you can stand, drink, and  urinate.  Your health care providers will treat your pain and side effects before you go home.  Do not drive for 24 hours if you received a sedative.  You may: ? Feel nauseous and vomit. ? Have a sore throat. ? Have mental slowness. ? Feel cold or shivery. ? Feel sleepy. ? Feel tired. ? Feel sore or achy, even in parts of your body where you did not have surgery. This information is not intended to replace advice given to you by your health care provider. Make sure you discuss any questions you have with your health care provider. Document Released: 12/17/2007 Document Revised: 02/20/2016 Document Reviewed: 08/24/2015 Elsevier Interactive Patient Education  2018 [employer]. General Anesthesia, Adult, Care After These instructions provide you with information about caring for yourself after your procedure. Your health care provider may also give you more specific instructions. Your treatment has been planned according to current medical practices, but problems sometimes occur. Call your health care provider if you have any problems or questions after your procedure. What can I expect after the procedure? After the procedure, it is common to have:  Vomiting.  A sore throat.  Mental slowness.  It is common to feel:  Nauseous.  Cold or shivery.  Sleepy.  Tired.  Sore or achy, even in parts of your body where you did not have surgery.  Follow these instructions at home: For at least 24 hours after the procedure:  Do not: ? Participate in activities where you could fall or become injured. ? Drive. ? Use heavy machinery. ? Drink alcohol. ? Take sleeping pills or medicines that cause drowsiness. ? Make important decisions or sign legal documents. ? Take care of children on  your own.  Rest. Eating and drinking  If you vomit, drink water, juice, or soup when you can drink without vomiting.  Drink enough fluid to keep your urine clear or pale yellow.  Make sure you  have little or no nausea before eating solid foods.  Follow the diet recommended by your health care provider. General instructions  Have a responsible adult stay with you until you are awake and alert.  Return to your normal activities as told by your health care provider. Ask your health care provider what activities are safe for you.  Take over-the-counter and prescription medicines only as told by your health care provider.  If you smoke, do not smoke without supervision.  Keep all follow-up visits as told by your health care provider. This is important. Contact a health care provider if:  You continue to have nausea or vomiting at home, and medicines are not helpful.  You cannot drink fluids or start eating again.  You cannot urinate after 8-12 hours.  You develop a skin rash.  You have fever.  You have increasing redness at the site of your procedure. Get help right away if:  You have difficulty breathing.  You have chest pain.  You have unexpected bleeding.  You feel that you are having a life-threatening or urgent problem. This information is not intended to replace advice given to you by your health care provider. Make sure you discuss any questions you have with your health care provider. Document Released: 12/16/2000 Document Revised: 02/12/2016 Document Reviewed: 08/24/2015 Elsevier Interactive Patient Education  [employer].

## 2018-08-07 ENCOUNTER — Emergency Department (HOSPITAL_COMMUNITY): Payer: BLUE CROSS/BLUE SHIELD

## 2018-08-07 ENCOUNTER — Encounter (HOSPITAL_COMMUNITY): Payer: Self-pay

## 2018-08-07 ENCOUNTER — Encounter (HOSPITAL_COMMUNITY)
Admission: RE | Admit: 2018-08-07 | Discharge: 2018-08-07 | Disposition: A | Discharge status: Home / Self Care | Payer: BLUE CROSS/BLUE SHIELD | Source: Ambulatory Visit | Attending: General Surgery | Admitting: General Surgery

## 2018-08-07 ENCOUNTER — Encounter (HOSPITAL_COMMUNITY): Payer: Self-pay | Admitting: *Deleted

## 2018-08-07 ENCOUNTER — Other Ambulatory Visit: Payer: Self-pay

## 2018-08-07 ENCOUNTER — Emergency Department (HOSPITAL_COMMUNITY)
Admission: EM | Admit: 2018-08-07 | Discharge: 2018-08-07 | Disposition: A | Discharge status: Home / Self Care | Payer: BLUE CROSS/BLUE SHIELD | Attending: Emergency Medicine | Admitting: Emergency Medicine

## 2018-08-07 DIAGNOSIS — R1084 Generalized abdominal pain: Secondary | ICD-10-CM | POA: Diagnosis not present

## 2018-08-07 DIAGNOSIS — Z8673 Personal history of transient ischemic attack (TIA), and cerebral infarction without residual deficits: Secondary | ICD-10-CM | POA: Diagnosis not present

## 2018-08-07 DIAGNOSIS — Z79899 Other long term (current) drug therapy: Secondary | ICD-10-CM | POA: Insufficient documentation

## 2018-08-07 DIAGNOSIS — R1011 Right upper quadrant pain: Secondary | ICD-10-CM | POA: Insufficient documentation

## 2018-08-07 DIAGNOSIS — K828 Other specified diseases of gallbladder: Secondary | ICD-10-CM

## 2018-08-07 DIAGNOSIS — Z7982 Long term (current) use of aspirin: Secondary | ICD-10-CM | POA: Diagnosis not present

## 2018-08-07 DIAGNOSIS — K839 Disease of biliary tract, unspecified: Secondary | ICD-10-CM | POA: Diagnosis not present

## 2018-08-07 DIAGNOSIS — R52 Pain, unspecified: Secondary | ICD-10-CM | POA: Diagnosis not present

## 2018-08-07 LAB — URINALYSIS, ROUTINE W REFLEX MICROSCOPIC
Bilirubin Urine: NEGATIVE
Glucose, UA: NEGATIVE mg/dL
Hgb urine dipstick: NEGATIVE
KETONES UR: NEGATIVE mg/dL
Leukocytes, UA: NEGATIVE
NITRITE: NEGATIVE
PH: 6 (ref 5.0–8.0)
Protein, ur: NEGATIVE mg/dL
SPECIFIC GRAVITY, URINE: 1.005 (ref 1.005–1.030)

## 2018-08-07 LAB — COMPREHENSIVE METABOLIC PANEL
ALBUMIN: 4.5 g/dL (ref 3.5–5.0)
ALT: 22 U/L (ref 0–44)
ANION GAP: 6 (ref 5–15)
AST: 24 U/L (ref 15–41)
Alkaline Phosphatase: 42 U/L (ref 38–126)
BUN: 15 mg/dL (ref 6–20)
CO2: 23 mmol/L (ref 22–32)
Calcium: 10.1 mg/dL (ref 8.9–10.3)
Chloride: 110 mmol/L (ref 98–111)
Creatinine, Ser: 0.67 mg/dL (ref 0.44–1.00)
GFR calc Af Amer: >60 mL/min (ref >60)
GFR calc non Af Amer: >60 mL/min (ref >60)
GLUCOSE: 91 mg/dL (ref 70–99)
Potassium: 3.5 mmol/L (ref 3.5–5.1)
SODIUM: 139 mmol/L (ref 135–145)
TOTAL PROTEIN: 7.9 g/dL (ref 6.5–8.1)
Total Bilirubin: 0.5 mg/dL (ref 0.3–1.2)

## 2018-08-07 LAB — CBC WITH DIFFERENTIAL/PLATELET
Abs Immature Granulocytes: 0.02 10*3/uL (ref 0.00–0.07)
BASOS ABS: 0 10*3/uL (ref 0.0–0.1)
Basophils Relative: 1 %
EOS ABS: 0 10*3/uL (ref 0.0–0.5)
EOS PCT: 1 %
HCT: 41.5 % (ref 36.0–46.0)
Hemoglobin: 13.9 g/dL (ref 12.0–15.0)
Immature Granulocytes: 0 %
Lymphocytes Relative: 29 %
Lymphs Abs: 1.7 10*3/uL (ref 0.7–4.0)
MCH: 29.4 pg (ref 26.0–34.0)
MCHC: 33.5 g/dL (ref 30.0–36.0)
MCV: 87.7 fL (ref 80.0–100.0)
Monocytes Absolute: 0.4 10*3/uL (ref 0.1–1.0)
Monocytes Relative: 7 %
NEUTROS PCT: 62 %
NRBC: 0 % (ref 0.0–0.2)
Neutro Abs: 3.8 10*3/uL (ref 1.7–7.7)
Platelets: 295 10*3/uL (ref 150–400)
RBC: 4.73 MIL/uL (ref 3.87–5.11)
RDW: 12.5 % (ref 11.5–15.5)
WBC: 6 10*3/uL (ref 4.0–10.5)

## 2018-08-07 LAB — LIPASE, BLOOD: Lipase: 31 U/L (ref 11–51)

## 2018-08-07 LAB — PREGNANCY, URINE: Preg Test, Ur: NEGATIVE

## 2018-08-07 MED ORDER — SODIUM CHLORIDE 0.9 % IV BOLUS
1000.0000 mL | Freq: Once | INTRAVENOUS | Status: AC
  Administered 2018-08-07: 1000 mL via INTRAVENOUS

## 2018-08-07 MED ORDER — ONDANSETRON 4 MG PO TBDP: ORAL_TABLET | ORAL | 0 refills | Status: DC

## 2018-08-07 MED ORDER — MORPHINE SULFATE (PF) 4 MG/ML IV SOLN
4.0000 mg | Freq: Once | INTRAVENOUS | Status: AC
  Administered 2018-08-07: 4 mg via INTRAVENOUS
  Filled 2018-08-07: qty 1

## 2018-08-07 MED ORDER — HYDROMORPHONE HCL 1 MG/ML IJ SOLN
1.0000 mg | Freq: Once | INTRAMUSCULAR | Status: AC
  Administered 2018-08-07: 1 mg via INTRAVENOUS
  Filled 2018-08-07: qty 1

## 2018-08-07 MED ORDER — OXYCODONE-ACETAMINOPHEN 5-325 MG PO TABS: 1.0000 | ORAL_TABLET | Freq: Four times a day (QID) | ORAL | 0 refills | Status: DC | PRN

## 2018-08-07 MED ORDER — ONDANSETRON HCL 4 MG/2ML IJ SOLN
4.0000 mg | Freq: Once | INTRAMUSCULAR | Status: AC
  Administered 2018-08-07: 4 mg via INTRAVENOUS
  Filled 2018-08-07: qty 2

## 2018-08-07 NOTE — ED Notes (Signed)
Beeped to [phone].Henreitta LeberBridges

## 2018-08-07 NOTE — Discharge Instructions (Signed)
Your work-up today is very reassuring.  Use pain medication and nausea medication as needed, follow-up for surgery with Dr. Henreitta LeberBridges next week as planned.  Return to the emergency department for worsening pain, persistent vomiting, fevers or any other new or concerning symptoms.

## 2018-08-07 NOTE — ED Provider Notes (Signed)
[facility] Provider Note   CSN: [id number] Arrival date & time: 08/07/18  1331     History   Chief Complaint Chief Complaint  Patient presents with  . Abdominal Pain    HPI Carrie [name] is a 42 y.o. female.  Carrie [name] is a 42 y.o. Female with a history of TIA and biliary dyskinesia, who presents to the emergency department for evaluation of sudden onset right upper quadrant abdominal pain.  She reports pain started immediately after eating lunch, she reports that she had an Ensure milkshake and crackers for lunch today.  She reports that pain has been constant and severe since onset, she reports associated nausea but no vomiting.  No diarrhea, melena or hematochezia.  No fevers or chills.  No urinary symptoms.  Patient denies any chest pain or shortness of breath, no cough.  Patient is followed by Dr. Henreitta LeberBridges with general surgery and is scheduled to have her gallbladder removed on the 20th, reports she has not had pain this severe with her gallbladder issues so far.     Past Medical History:  Diagnosis Date  . TIA (transient ischemic attack) 03/2018    Patient Active Problem List   Diagnosis Date Noted  . Biliary dyskinesia 07/21/2018  . TIA (transient ischemic attack) 04/02/2018    Past Surgical History:  Procedure Laterality Date  . APPENDECTOMY    . TUBAL LIGATION       OB History   None      Home Medications    Prior to Admission medications   Medication Sig Start Date End Date Taking? Authorizing Provider  aspirin 81 MG chewable tablet Chew 1 tablet (81 mg total) by mouth daily. 04/04/18   Philip AspenHernandez Acosta, Limmie PatriciaEstela Y, MD  Multiple Vitamins-Minerals (ALIVE WOMENS ENERGY PO) Take 1 tablet by mouth daily.    [provider]  simvastatin (ZOCOR) 20 MG tablet Take 1 tablet (20 mg total) by mouth daily at 6 PM. 04/03/18   Philip AspenHernandez Acosta, Limmie PatriciaEstela Y, MD    Family History Family History  Problem Relation Age of Onset  .  Hypertension Mother   . Colon cancer Mother   . Hypertension Father     Social History Social History   Tobacco Use  . Smoking status: Never Smoker  . Smokeless tobacco: Never Used  Substance Use Topics  . Alcohol use: No  . Drug use: No     Allergies   Shellfish allergy and Ciprofloxacin   Review of Systems Review of Systems  Constitutional: Negative for chills and fever.  Respiratory: Negative for cough and shortness of breath.   Cardiovascular: Negative for chest pain.  Gastrointestinal: Positive for abdominal pain. Negative for blood in stool, nausea and vomiting.  Genitourinary: Negative for dysuria, flank pain, frequency and hematuria.  Musculoskeletal: Negative for arthralgias and myalgias.  Skin: Negative for color change and rash.  Neurological: Negative for dizziness, syncope and light-headedness.     Physical Exam Updated Vital Signs BP 120/76 (BP Location: Left Arm)   Pulse (!) 111   Temp 98.1 F (36.7 C) (Oral)   Resp 20   Ht 5\' 1"  (1.549 m)   Wt 68 kg   SpO2 99%   BMI 28.34 kg/m   Physical Exam  Constitutional: She appears well-developed and well-nourished. No distress.  HENT:  Head: Normocephalic and atraumatic.  Mouth/Throat: Oropharynx is clear and moist.  Eyes: Right eye exhibits no discharge. Left eye exhibits no discharge.  Cardiovascular: Normal rate, regular rhythm,  normal heart sounds and intact distal pulses. Exam reveals no gallop and no friction rub.  No murmur heard. Pulmonary/Chest: Effort normal and breath sounds normal. No respiratory distress.  Respirations equal and unlabored, patient able to speak in full sentences, lungs clear to auscultation bilaterally  Abdominal: Soft. Normal appearance and bowel sounds are normal. She exhibits no distension. There is tenderness in the right upper quadrant. There is guarding and positive Murphy's sign. There is no rigidity, no rebound and no CVA tenderness.  Abdomen is soft, nondistended,  bowel sounds present throughout, there is focal tenderness with guarding in the right upper quadrant with positive Murphy sign, abdomen is otherwise unremarkable, no CVA tenderness bilaterally.  Neurological: She is alert. Coordination normal.  Skin: Skin is warm and dry. Capillary refill takes less than 2 seconds. She is not diaphoretic.  Psychiatric: She has a normal mood and affect. Her behavior is normal.  Nursing note and vitals reviewed.    ED Treatments / Results  Labs (all labs ordered are listed, but only abnormal results are displayed) Labs Reviewed  URINALYSIS, ROUTINE W REFLEX MICROSCOPIC - Abnormal; Notable for the following components:      Result Value   Color, Urine STRAW (*)    All other components within normal limits  CBC WITH DIFFERENTIAL/PLATELET  COMPREHENSIVE METABOLIC PANEL  LIPASE, BLOOD  PREGNANCY, URINE    EKG None  Radiology US Abdomen Limited Ruq  Result Date: 08/07/2018 CLINICAL DATA:  Right upper quadrant pain. EXAM: ULTRASOUND ABDOMEN LIMITED RIGHT UPPER QUADRANT COMPARISON:  None. FINDINGS: Gallbladder: No gallstones or wall thickening visualized. No sonographic Murphy sign noted by sonographer. Common bile duct: Diameter: 6 mm Liver: No focal lesion identified. Within normal limits in parenchymal echogenicity. Portal vein is patent on color Doppler imaging with normal direction of blood flow towards the liver. IMPRESSION: Negative exam. Electronically Signed   By: Maisie Fus  Register   On: 08/07/2018 15:12    Procedures Procedures (including critical care time)  Medications Ordered in ED Medications  sodium chloride 0.9 % bolus 1,000 mL (1,000 mLs Intravenous New Bag/Given 08/07/18 1424)  ondansetron (ZOFRAN) injection 4 mg (4 mg Intravenous Given 08/07/18 1426)  morphine 4 MG/ML injection 4 mg (4 mg Intravenous Given 08/07/18 1424)  HYDROmorphone (DILAUDID) injection 1 mg (1 mg Intravenous Given 08/07/18 1530)     Initial Impression /  Assessment and Plan / ED Course  I have reviewed the triage vital signs and the nursing notes.  Pertinent labs & imaging results that were available during my care of the patient were reviewed by me and considered in my medical decision making (see chart for details).  Patient presents to the emergency department for evaluation of right upper quadrant abdominal pain, she has known history of biliary dyskinesia, is followed by Dr. Henreitta Leber with general surgery and has planned elective cholecystectomy on 11/20.  Acute onset of pain after lunch today, patient is focally tender in the right upper quadrant, she is tachycardic on arrival, I think this is likely due to pain, all other vitals are normal.  Will get abdominal labs and right upper quadrant ultrasound.  Will give IV fluids, morphine and Zofran for symptomatic management.  Evaluation is very reassuring, no leukocytosis and normal hemoglobin, no acute electrolyte derangements, normal renal and liver function, normal lipase, urinalysis without any signs of infection and negative pregnancy.  Right upper quadrant ultrasound reveals no signs of acute cholecystitis there are no gallstones or gallbladder wall thickening, common bile duct is within  normal limits.  Given the patient is followed by Dr. Henreitta Leber with surgery who is on-call today will discuss case with her.  She reports that as long as patient's pain can be managed she would prefer to continue with plan of elective surgery next week given patient's very reassuring work-up.  Will give additional dose of pain medication here, and plan for discharge home with pain medication, strict return precautions and close follow-up with Dr. Henreitta Leber.\   Final Clinical Impressions(s) / ED Diagnoses   Final diagnoses:  RUQ pain  Biliary dyskinesia    ED Discharge Orders         Ordered    oxyCODONE-acetaminophen (PERCOCET) 5-325 MG tablet  Every 6 hours PRN     08/07/18 1616    ondansetron (ZOFRAN  ODT) 4 MG disintegrating tablet     08/07/18 1616           Dartha Lodge, [place] 08/07/18 1617    Samuel Jester, DO 08/10/18 2216

## 2018-08-07 NOTE — ED Notes (Signed)
Scheduled for GALLBLADDER SURGERY ON THE 20TH, right upper quadrant pain today

## 2018-08-10 ENCOUNTER — Encounter (HOSPITAL_COMMUNITY): Payer: Self-pay

## 2018-08-11 DIAGNOSIS — R1084 Generalized abdominal pain: Secondary | ICD-10-CM | POA: Diagnosis not present

## 2018-08-12 ENCOUNTER — Ambulatory Visit (HOSPITAL_COMMUNITY): Payer: BLUE CROSS/BLUE SHIELD | Admitting: Anesthesiology

## 2018-08-12 ENCOUNTER — Encounter (HOSPITAL_COMMUNITY): Payer: Self-pay | Admitting: *Deleted

## 2018-08-12 ENCOUNTER — Encounter (HOSPITAL_COMMUNITY)
Admission: RE | Disposition: A | Discharge status: Home / Self Care | Payer: Self-pay | Source: Ambulatory Visit | Attending: General Surgery

## 2018-08-12 ENCOUNTER — Ambulatory Visit (HOSPITAL_COMMUNITY)
Admission: RE | Admit: 2018-08-12 | Discharge: 2018-08-12 | Disposition: A | Discharge status: Home / Self Care | Payer: BLUE CROSS/BLUE SHIELD | Source: Ambulatory Visit | Attending: General Surgery | Admitting: General Surgery

## 2018-08-12 DIAGNOSIS — Z800 Family history of malignant neoplasm of digestive organs: Secondary | ICD-10-CM | POA: Insufficient documentation

## 2018-08-12 DIAGNOSIS — Z91013 Allergy to seafood: Secondary | ICD-10-CM | POA: Diagnosis not present

## 2018-08-12 DIAGNOSIS — Z79899 Other long term (current) drug therapy: Secondary | ICD-10-CM | POA: Insufficient documentation

## 2018-08-12 DIAGNOSIS — K828 Other specified diseases of gallbladder: Secondary | ICD-10-CM | POA: Diagnosis present

## 2018-08-12 DIAGNOSIS — K811 Chronic cholecystitis: Secondary | ICD-10-CM | POA: Diagnosis not present

## 2018-08-12 DIAGNOSIS — Z8249 Family history of ischemic heart disease and other diseases of the circulatory system: Secondary | ICD-10-CM | POA: Diagnosis not present

## 2018-08-12 DIAGNOSIS — Z881 Allergy status to other antibiotic agents status: Secondary | ICD-10-CM | POA: Diagnosis not present

## 2018-08-12 DIAGNOSIS — Z7982 Long term (current) use of aspirin: Secondary | ICD-10-CM | POA: Insufficient documentation

## 2018-08-12 DIAGNOSIS — Z8673 Personal history of transient ischemic attack (TIA), and cerebral infarction without residual deficits: Secondary | ICD-10-CM | POA: Insufficient documentation

## 2018-08-12 HISTORY — PX: CHOLECYSTECTOMY: SHX55

## 2018-08-12 SURGERY — LAPAROSCOPIC CHOLECYSTECTOMY
Anesthesia: General

## 2018-08-12 MED ORDER — FENTANYL CITRATE (PF) 250 MCG/5ML IJ SOLN
INTRAMUSCULAR | Status: AC
  Filled 2018-08-12: qty 5

## 2018-08-12 MED ORDER — MIDAZOLAM HCL 5 MG/5ML IJ SOLN
INTRAMUSCULAR | Status: DC | PRN
  Administered 2018-08-12: 2 mg via INTRAVENOUS

## 2018-08-12 MED ORDER — LIDOCAINE HCL (CARDIAC) PF 50 MG/5ML IV SOSY
PREFILLED_SYRINGE | INTRAVENOUS | Status: DC | PRN
  Administered 2018-08-12: 50 mg via INTRAVENOUS

## 2018-08-12 MED ORDER — FENTANYL CITRATE (PF) 100 MCG/2ML IJ SOLN
INTRAMUSCULAR | Status: DC | PRN
  Administered 2018-08-12 (×5): 50 ug via INTRAVENOUS

## 2018-08-12 MED ORDER — ROCURONIUM 10MG/ML (10ML) SYRINGE FOR MEDFUSION PUMP - OPTIME
INTRAVENOUS | Status: DC | PRN
  Administered 2018-08-12: 45 mg via INTRAVENOUS

## 2018-08-12 MED ORDER — PROPOFOL 10 MG/ML IV BOLUS
INTRAVENOUS | Status: DC | PRN
  Administered 2018-08-12: 130 mg via INTRAVENOUS

## 2018-08-12 MED ORDER — CHLORHEXIDINE GLUCONATE CLOTH 2 % EX PADS: 6.0000 | MEDICATED_PAD | Freq: Once | CUTANEOUS | Status: DC

## 2018-08-12 MED ORDER — SUGAMMADEX SODIUM 200 MG/2ML IV SOLN
INTRAVENOUS | Status: DC | PRN
  Administered 2018-08-12: 25 mg via INTRAVENOUS

## 2018-08-12 MED ORDER — KETOROLAC TROMETHAMINE 30 MG/ML IJ SOLN
30.0000 mg | Freq: Once | INTRAMUSCULAR | Status: AC
  Administered 2018-08-12: 30 mg via INTRAVENOUS

## 2018-08-12 MED ORDER — DOCUSATE SODIUM 100 MG PO CAPS: 100.0000 mg | ORAL_CAPSULE | Freq: Two times a day (BID) | ORAL | 2 refills | Status: DC

## 2018-08-12 MED ORDER — MIDAZOLAM HCL 2 MG/2ML IJ SOLN
INTRAMUSCULAR | Status: AC
  Filled 2018-08-12: qty 2

## 2018-08-12 MED ORDER — BUPIVACAINE HCL (PF) 0.5 % IJ SOLN
INTRAMUSCULAR | Status: AC
  Filled 2018-08-12: qty 30

## 2018-08-12 MED ORDER — BUPIVACAINE HCL (PF) 0.5 % IJ SOLN
INTRAMUSCULAR | Status: DC | PRN
  Administered 2018-08-12: 10 mL

## 2018-08-12 MED ORDER — OXYCODONE HCL 5 MG PO TABS: 5.0000 mg | ORAL_TABLET | ORAL | 0 refills | Status: DC | PRN

## 2018-08-12 MED ORDER — KETOROLAC TROMETHAMINE 30 MG/ML IJ SOLN
INTRAMUSCULAR | Status: AC
  Filled 2018-08-12: qty 1

## 2018-08-12 MED ORDER — SODIUM CHLORIDE 0.9 % IV SOLN
2.0000 g | INTRAVENOUS | Status: AC
  Administered 2018-08-12: 2 g via INTRAVENOUS
  Filled 2018-08-12: qty 2

## 2018-08-12 MED ORDER — PROMETHAZINE HCL 25 MG/ML IJ SOLN: 6.2500 mg | INTRAMUSCULAR | Status: DC | PRN

## 2018-08-12 MED ORDER — SODIUM CHLORIDE 0.9 % IR SOLN
Status: DC | PRN
  Administered 2018-08-12: 1000 mL

## 2018-08-12 MED ORDER — HYDROMORPHONE HCL 1 MG/ML IJ SOLN
0.5000 mg | INTRAMUSCULAR | Status: DC | PRN
  Administered 2018-08-12 (×2): 0.5 mg via INTRAVENOUS
  Filled 2018-08-12: qty 0.5

## 2018-08-12 MED ORDER — PROPOFOL 10 MG/ML IV BOLUS
INTRAVENOUS | Status: AC
  Filled 2018-08-12: qty 40

## 2018-08-12 MED ORDER — HEMOSTATIC AGENTS (NO CHARGE) OPTIME
TOPICAL | Status: DC | PRN
  Administered 2018-08-12: 1 via TOPICAL

## 2018-08-12 MED ORDER — HYDROCODONE-ACETAMINOPHEN 7.5-325 MG PO TABS: 1.0000 | ORAL_TABLET | Freq: Once | ORAL | Status: DC | PRN

## 2018-08-12 MED ORDER — LACTATED RINGERS IV SOLN
INTRAVENOUS | Status: DC
  Administered 2018-08-12: 1000 mL via INTRAVENOUS

## 2018-08-12 MED ORDER — HYDROMORPHONE HCL 1 MG/ML IJ SOLN
0.2500 mg | INTRAMUSCULAR | Status: DC | PRN
  Administered 2018-08-12 (×4): 0.5 mg via INTRAVENOUS
  Filled 2018-08-12 (×3): qty 0.5

## 2018-08-12 MED ORDER — POVIDONE-IODINE 10 % EX OINT
TOPICAL_OINTMENT | CUTANEOUS | Status: AC
  Filled 2018-08-12: qty 1

## 2018-08-12 MED ORDER — MIDAZOLAM HCL 2 MG/2ML IJ SOLN: 0.5000 mg | Freq: Once | INTRAMUSCULAR | Status: DC | PRN

## 2018-08-12 MED ORDER — ONDANSETRON HCL 4 MG/2ML IJ SOLN
INTRAMUSCULAR | Status: DC | PRN
  Administered 2018-08-12: 4 mg via INTRAVENOUS

## 2018-08-12 SURGICAL SUPPLY — 46 items
APPLIER CLIP ROT 10 11.4 M/L (STAPLE) ×3
BAG RETRIEVAL 10 (BASKET) ×1
BAG RETRIEVAL 10MM (BASKET) ×1
BLADE SURG 15 STRL LF DISP TIS (BLADE) ×1 IMPLANT
BLADE SURG 15 STRL SS (BLADE) ×2
CHLORAPREP W/TINT 26ML (MISCELLANEOUS) ×3 IMPLANT
CLIP APPLIE ROT 10 11.4 M/L (STAPLE) ×1 IMPLANT
CLOTH BEACON ORANGE TIMEOUT ST (SAFETY) ×3 IMPLANT
COVER LIGHT HANDLE STERIS (MISCELLANEOUS) ×6 IMPLANT
DECANTER SPIKE VIAL GLASS SM (MISCELLANEOUS) ×3 IMPLANT
DERMABOND ADVANCED (GAUZE/BANDAGES/DRESSINGS) ×2
DERMABOND ADVANCED .7 DNX12 (GAUZE/BANDAGES/DRESSINGS) ×1 IMPLANT
ELECT REM PT RETURN 9FT ADLT (ELECTROSURGICAL) ×3
ELECTRODE REM PT RTRN 9FT ADLT (ELECTROSURGICAL) ×1 IMPLANT
FILTER SMOKE EVAC LAPAROSHD (FILTER) ×3 IMPLANT
GLOVE BIO SURGEON STRL SZ 6.5 (GLOVE) ×4 IMPLANT
GLOVE BIO SURGEONS STRL SZ 6.5 (GLOVE) ×2
GLOVE BIOGEL M STRL SZ7.5 (GLOVE) ×3 IMPLANT
GLOVE BIOGEL PI IND STRL 6.5 (GLOVE) ×2 IMPLANT
GLOVE BIOGEL PI IND STRL 7.0 (GLOVE) ×4 IMPLANT
GLOVE BIOGEL PI INDICATOR 6.5 (GLOVE) ×4
GLOVE BIOGEL PI INDICATOR 7.0 (GLOVE) ×8
GOWN STRL REUS W/TWL LRG LVL3 (GOWN DISPOSABLE) ×9 IMPLANT
HEMOSTAT SNOW SURGICEL 2X4 (HEMOSTASIS) ×3 IMPLANT
INST SET LAPROSCOPIC AP (KITS) ×3 IMPLANT
IV NS IRRIG 3000ML ARTHROMATIC (IV SOLUTION) IMPLANT
KIT TURNOVER KIT A (KITS) ×3 IMPLANT
MANIFOLD NEPTUNE II (INSTRUMENTS) ×3 IMPLANT
NEEDLE INSUFFLATION 14GA 120MM (NEEDLE) ×3 IMPLANT
NS IRRIG 1000ML POUR BTL (IV SOLUTION) ×3 IMPLANT
PACK LAP CHOLE LZT030E (CUSTOM PROCEDURE TRAY) ×3 IMPLANT
PAD ARMBOARD 7.5X6 YLW CONV (MISCELLANEOUS) ×3 IMPLANT
SET BASIN LINEN APH (SET/KITS/TRAYS/PACK) ×3 IMPLANT
SET TUBE IRRIG SUCTION NO TIP (IRRIGATION / IRRIGATOR) IMPLANT
SLEEVE ENDOPATH XCEL 5M (ENDOMECHANICALS) ×3 IMPLANT
SUT MNCRL AB 4-0 PS2 18 (SUTURE) ×3 IMPLANT
SUT VICRYL 0 UR6 27IN ABS (SUTURE) ×3 IMPLANT
SYS BAG RETRIEVAL 10MM (BASKET) ×1
SYSTEM BAG RETRIEVAL 10MM (BASKET) ×1 IMPLANT
TROCAR ENDO BLADELESS 11MM (ENDOMECHANICALS) ×3 IMPLANT
TROCAR XCEL NON-BLD 5MMX100MML (ENDOMECHANICALS) ×3 IMPLANT
TROCAR XCEL UNIV SLVE 11M 100M (ENDOMECHANICALS) ×3 IMPLANT
TUBE CONNECTING 12'X1/4 (SUCTIONS) ×1
TUBE CONNECTING 12X1/4 (SUCTIONS) ×2 IMPLANT
TUBING INSUFFLATION (TUBING) ×3 IMPLANT
WARMER LAPAROSCOPE (MISCELLANEOUS) ×3 IMPLANT

## 2018-08-12 NOTE — Op Note (Addendum)
Operative Note   Preoperative Diagnosis: Biliary dyskinesia    Postoperative Diagnosis: Same   Procedure(s) Performed: Laparoscopic cholecystectomy   Surgeon: Lillia AbedLindsay C. Henreitta Leber, MD   Assistants: Franky MachoMark Jenkins, MD    Anesthesia: General endotracheal   Anesthesiologist: Dr. Lemont FillersNabonsal    Specimens: Gallbladder    Estimated Blood Loss: Minimal    Blood Replacement: None    Complications: None    Operative Findings: Distended gallbladder    Procedure: The patient was taken to the operating room and placed supine. General endotracheal anesthesia was induced. Intravenous antibiotics were administered per protocol. An orogastric tube positioned to decompress the stomach. The abdomen was prepared and draped in the usual sterile fashion.    A supraumbilical incision was made and a Veress technique was utilized to achieve pneumoperitoneum to 15 mmHg with carbon dioxide. A 11 mm optiview port was placed through the supraumbilical region, and a 10 mm 0-degree operative laparoscope was introduced. The area underlying the trocar and Veress needle were inspected and without evidence of injury.  Remaining trocars were placed under direct vision. Two 5 mm ports were placed in the right abdomen, between the anterior axillary and midclavicular line.  A final 11 mm port was placed through the mid-epigastrium, near the falciform ligament.    The gallbladder fundus was elevated cephalad and the infundibulum was retracted to the patient's right. The gallbladder/cystic duct junction was skeletonized. The cystic artery noted in the triangle of Calot and was also skeletonized.  We then continued liberal medial and lateral dissection until the critical view of safety was achieved.    The cystic duct was triply clipped and divided, and cystic artery were doubly clipped and divided.  There was a small vessel < 1mm size that appeared to go between the cystic duct and artery and this was clipped and divided.  The  gallbladder was then dissected from the liver bed with electrocautery. The specimen was placed in an Endopouch and was retrieved through the epigastric site.   Final inspection revealed acceptable hemostasis. Surgicel Jamelle HaringSnow was placed in the gallbladder bed. Trocars were removed and pneumoperitoneum was released. The epigastric was at the ribs and umbilical port site was smaller than my finger tip. Skin incisions were closed with 4-0 Monocryl subcuticular sutures and Dermabond. The patient was awakened from anesthesia and extubated without complication.    Algis GreenhouseLindsay , MD [facility] [address] [place], [place] [id number] [phone] (office)

## 2018-08-12 NOTE — Interval H&P Note (Signed)
History and Physical Interval Note:  08/12/2018 7:15 AM  Carrie [name]  has presented today for surgery, with the diagnosis of biliary dyskinesia  The various methods of treatment have been discussed with the patient and family. After consideration of risks, benefits and other options for treatment, the patient has consented to  Procedure(s): LAPAROSCOPIC CHOLECYSTECTOMY (N/A) as a surgical intervention .  The patient's history has been reviewed, patient examined, no change in status, stable for surgery.  I have reviewed the patient's chart and labs.  Questions were answered to the patient's satisfaction.     Seen in ED over weekend. Ruq pain continues. Plan remains.  Lucretia RoersLindsay C Bridges

## 2018-08-12 NOTE — Discharge Instructions (Signed)
Discharge Instructions: Shower per your regular routine. Take tylenol and ibuprofen as needed for pain control, alternating every 4-6 hours.  Take Roxicodone for breakthrough pain. Take colace for constipation related to narcotic pain medication. Do not pick at the dermabond glue on your incision sites.  If you have questions or concerns, please call our office, [phone], Monday- Thursday 8AM-5PM and Friday 8AM-12Noon.  If it is after hours or on the weekend, please call [facility], [phone], and ask to speak to the surgeon on call for Dr. Henreitta Leber at [facility].    Laparoscopic Cholecystectomy, Care After This sheet gives you information about how to care for yourself after your procedure. Your doctor may also give you more specific instructions. If you have problems or questions, contact your doctor. Follow these instructions at home: Care for cuts from surgery (incisions)   Follow instructions from your doctor about how to take care of your cuts from surgery. Make sure you: ? Wash your hands with soap and water before you change your bandage (dressing). If you cannot use soap and water, use hand sanitizer. ? Change your bandage as told by your doctor. ? Leave stitches (sutures), skin glue, or skin tape (adhesive) strips in place. They may need to stay in place for 2 weeks or longer. If tape strips get loose and curl up, you may trim the loose edges. Do not remove tape strips completely unless your doctor says it is okay.  Do not take baths, swim, or use a hot tub until your doctor says it is okay. You may shower.   Check your surgical cut area every day for signs of infection. Check for: ? More redness, swelling, or pain. ? More fluid or blood. ? Warmth. ? Pus or a bad smell. Activity  Do not drive or use heavy machinery while taking prescription pain medicine.  Do not lift anything that is heavier than 10 lb (4.5 kg) until your doctor says it is okay.  Do not play  contact sports until your doctor says it is okay.  Do not drive for 24 hours if you were given a medicine to help you relax (sedative).  Rest as needed. Do not return to work or school until your doctor says it is okay. General instructions  Take over-the-counter and prescription medicines only as told by your doctor.  To prevent or treat constipation while you are taking prescription pain medicine, your doctor may recommend that you: ? Drink enough fluid to keep your pee (urine) clear or pale yellow. ? Take over-the-counter or prescription medicines. ? Eat foods that are high in fiber, such as fresh fruits and vegetables, whole grains, and beans. ? Limit foods that are high in fat and processed sugars, such as fried and sweet foods. Contact a doctor if:  You develop a rash.  You have more redness, swelling, or pain around your surgical cuts.  You have more fluid or blood coming from your surgical cuts.  Your surgical cuts feel warm to the touch.  You have pus or a bad smell coming from your surgical cuts.  You have a fever.  One or more of your surgical cuts breaks open. Get help right away if:  You have trouble breathing.  You have chest pain.  You have pain that is getting worse in your shoulders.  You faint or feel dizzy when you stand.  You have very bad pain in your belly (abdomen).  You are sick to your stomach (nauseous) for  more than one day.  You have throwing up (vomiting) that lasts for more than one day.  You have leg pain. This information is not intended to replace advice given to you by your health care provider. Make sure you discuss any questions you have with your health care provider. Document Released: 06/18/2008 Document Revised: 03/30/2016 Document Reviewed: 02/26/2016 Elsevier Interactive Patient Education  2018 [employer]

## 2018-08-12 NOTE — Anesthesia Postprocedure Evaluation (Signed)
Anesthesia Post Note  Patient: Carrie [name]  Procedure(s) Performed: LAPAROSCOPIC CHOLECYSTECTOMY (N/A )  Patient location during evaluation: PACU Anesthesia Type: General Level of consciousness: awake and alert and oriented Pain management: pain level controlled Vital Signs Assessment: post-procedure vital signs reviewed and stable Respiratory status: spontaneous breathing Cardiovascular status: blood pressure returned to baseline and stable Postop Assessment: no apparent nausea or vomiting and adequate PO intake Anesthetic complications: no Comments: Late entry     Last Vitals:  Vitals:   08/12/18 0930 08/12/18 0936  BP: (!) 133/98 (!) 146/91  Pulse:  84  Resp:  16  Temp: 36.6 C 36.7 C  SpO2:  94%    Last Pain:  Vitals:   08/12/18 0936  TempSrc:   PainSc: 5                  Magic Mohler

## 2018-08-12 NOTE — Transfer of Care (Signed)
Immediate Anesthesia Transfer of Care Note  Patient: Carrie RouseLilibeth Gleghorn  Procedure(s) Performed: LAPAROSCOPIC CHOLECYSTECTOMY (N/A )  Patient Location: PACU  Anesthesia Type:General  Level of Consciousness: awake  Airway & Oxygen Therapy: Patient Spontanous Breathing and Patient connected to nasal cannula oxygen  Post-op Assessment: Report given to RN  Post vital signs: Reviewed and stable  Last Vitals:  Vitals Value Taken Time  BP 142/101 08/12/2018  8:45 AM  Temp    Pulse 76 08/12/2018  8:45 AM  Resp 15 08/12/2018  8:45 AM  SpO2 96 % 08/12/2018  8:45 AM  Vitals shown include unvalidated device data.  Last Pain:  Vitals:   08/12/18 0654  TempSrc: Oral  PainSc: 5       Patients Stated Pain Goal: 8 (08/12/18 0654)  Complications: No apparent anesthesia complications

## 2018-08-12 NOTE — Anesthesia Preprocedure Evaluation (Signed)
Anesthesia Evaluation  Patient identified by MRN, date of birth, ID band Patient awake    Reviewed: Allergy & Precautions, NPO status , Patient's Chart, lab work & pertinent test results  Airway Mallampati: II  TM Distance: >3 FB Neck ROM: Full    Dental no notable dental hx.    Pulmonary neg pulmonary ROS,    Pulmonary exam normal breath sounds clear to auscultation       Cardiovascular Exercise Tolerance: Good negative cardio ROS Normal cardiovascular examI Rhythm:Regular Rate:Normal     Neuro/Psych TIAnegative psych ROS   GI/Hepatic negative GI ROS, Neg liver ROS,   Endo/Other  negative endocrine ROS  Renal/GU negative Renal ROS  negative genitourinary   Musculoskeletal negative musculoskeletal ROS (+)   Abdominal   Peds negative pediatric ROS (+)  Hematology negative hematology ROS (+)   Anesthesia Other Findings   Reproductive/Obstetrics negative OB ROS                             Anesthesia Physical Anesthesia Plan  ASA: II  Anesthesia Plan: General   Post-op Pain Management:    Induction: Intravenous  PONV Risk Score and Plan:   Airway Management Planned: Oral ETT  Additional Equipment:   Intra-op Plan:   Post-operative Plan: Extubation in OR  Informed Consent: I have reviewed the patients History and Physical, chart, labs and discussed the procedure including the risks, benefits and alternatives for the proposed anesthesia with the patient or authorized representative who has indicated his/her understanding and acceptance.   Dental advisory given  Plan Discussed with: CRNA  Anesthesia Plan Comments:         Anesthesia Quick Evaluation

## 2018-08-12 NOTE — Anesthesia Procedure Notes (Signed)
Procedure Name: Intubation Date/Time: 08/12/2018 7:46 AM Performed by: Ollen Bowl, CRNA Pre-anesthesia Checklist: Patient identified, Patient being monitored, Timeout performed, Emergency Drugs available and Suction available Patient Re-evaluated:Patient Re-evaluated prior to induction Oxygen Delivery Method: Circle System Utilized Preoxygenation: Pre-oxygenation with 100% oxygen Induction Type: IV induction Ventilation: Mask ventilation without difficulty Laryngoscope Size: Mac and 3 Grade View: Grade II Tube type: Oral Tube size: 7.0 mm Number of attempts: 1 Airway Equipment and Method: stylet Placement Confirmation: ETT inserted through vocal cords under direct vision,  positive ETCO2 and breath sounds checked- equal and bilateral Secured at: 21 cm Tube secured with: Tape Dental Injury: Teeth and Oropharynx as per pre-operative assessment

## 2018-08-13 ENCOUNTER — Encounter (HOSPITAL_COMMUNITY): Payer: Self-pay | Admitting: General Surgery

## 2018-08-24 ENCOUNTER — Other Ambulatory Visit (HOSPITAL_COMMUNITY): Payer: BLUE CROSS/BLUE SHIELD

## 2018-08-27 ENCOUNTER — Ambulatory Visit (INDEPENDENT_AMBULATORY_CARE_PROVIDER_SITE_OTHER): Payer: Self-pay | Admitting: General Surgery

## 2018-08-27 ENCOUNTER — Encounter: Payer: Self-pay | Admitting: General Surgery

## 2018-08-27 VITALS — BP 117/68 | HR 88 | Temp 97.8°F | Resp 16 | Wt 156.0 lb

## 2018-08-27 DIAGNOSIS — K828 Other specified diseases of gallbladder: Secondary | ICD-10-CM

## 2018-08-27 NOTE — Patient Instructions (Addendum)
Diet and activity as tolerated.

## 2018-08-27 NOTE — Progress Notes (Signed)
[facility] Note   HPI:  42 y.o. Female presents to clinic for post-op follow-up evaluation after a laparoscopic cholecystectomy. Patient reports she is doing well. She has been getting more active in the last 2 weeks and walked a lot Monday and vacuumed yesterday. She was sore. She is tolerating a diet.   Review of Systems:  Regular BMs Soreness with activity  Some dizziness at times All other review of systems: otherwise negative   Pathology: Diagnosis Gallbladder - CHRONIC CHOLECYSTITIS.  Vital Signs:  BP 117/68 (BP Location: Left Arm, Patient Position: Sitting, Cuff Size: Normal)   Pulse 88   Temp 97.8 F (36.6 C) (Temporal)   Resp 16   Wt 156 lb (70.8 kg)   BMI 29.48 kg/m    Physical Exam:  Physical Exam  Cardiovascular: Normal rate.  Pulmonary/Chest: Effort normal.  Abdominal: Soft. She exhibits no distension. There is no tenderness.  Port sites healing, no erythema or drainage  Musculoskeletal: Normal range of motion.  Vitals reviewed.  Assessment:  42 y.o. yo Female s/p Lap cholecystectomy for chronic cholecystitis. Doing well.   Plan:  - Activity increasing, expect some soreness, lifts 40+ lbs at work - Return to work 08/31/2018  - Dizziness not likely related to surgery as patient staying hydrated and 2 weeks out from anesthesia, if continue told to see PCP - Follow up PRN  All of the above recommendations were discussed with the patient, and all of patient's questions were answered to her expressed satisfaction.  Algis GreenhouseLindsay Dvante Hands, MD [facility] [address] [place], [place] [address] [phone] (office)

## 2018-12-21 DIAGNOSIS — J302 Other seasonal allergic rhinitis: Secondary | ICD-10-CM | POA: Diagnosis not present

## 2019-05-11 DIAGNOSIS — J029 Acute pharyngitis, unspecified: Secondary | ICD-10-CM | POA: Diagnosis not present

## 2019-05-16 IMAGING — NM NM HEPATO W/GB/PHARM/[PERSON_NAME]
2 series · 12 of 12 positions shown · non-contrast
Comparison: None.

CLINICAL DATA: Right upper quadrant pain

EXAM:
NUCLEAR MEDICINE HEPATOBILIARY IMAGING WITH GALLBLADDER EF
VIEWS:
Anterior right upper quadrant
RADIOPHARMACEUTICALS:  5.0 mCi Lc-44m  Choletec IV

[Series 1: biliary · 3.25mm/px · 6 of 60 frames shown]
[frame 6/60]
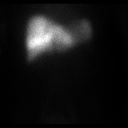
[frame 16/60]
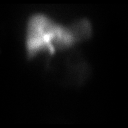
[frame 26/60]
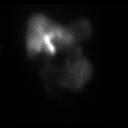
[frame 36/60]
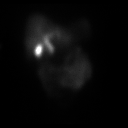
[frame 46/60]
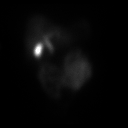
[frame 56/60]
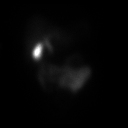

[Series 2: gbef · 3.25mm/px · 6 of 60 frames shown]
[frame 6/60]
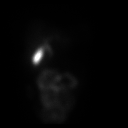
[frame 16/60]
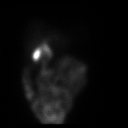
[frame 26/60]
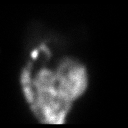
[frame 36/60]
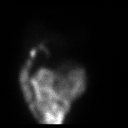
[frame 46/60]
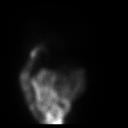
[frame 56/60]
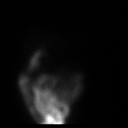

[12 of 12 positions shown; findings below may reference images not displayed]

FINDINGS: Liver uptake of radiotracer is normal. There is prompt visualization
of gallbladder and small bowel, indicating patency of the cystic and
common bile ducts. The patient consumed 8 ounces of Ensure orally
with calculation of the computer generated ejection fraction of
radiotracer from the gallbladder. Patient experienced right upper
quadrant pain after the oral Ensure consumption. The computer
generated ejection fraction of radiotracer from the gallbladder is
normal at 95%, normal greater than 33% using the oral agent.
IMPRESSION: Normal ejection fraction of radiotracer from the gallbladder.
Patient did experience clinical symptoms with the oral Ensure
consumption. Cystic and common bile ducts are patent as is evidenced
by visualization of gallbladder and small bowel.

## 2019-05-20 ENCOUNTER — Other Ambulatory Visit: Payer: Self-pay

## 2019-05-20 DIAGNOSIS — Z20822 Contact with and (suspected) exposure to covid-19: Secondary | ICD-10-CM

## 2019-05-21 LAB — NOVEL CORONAVIRUS, NAA: SARS-CoV-2, NAA: NOT DETECTED

## 2019-12-23 DIAGNOSIS — Z23 Encounter for immunization: Secondary | ICD-10-CM | POA: Diagnosis not present

## 2020-01-14 DIAGNOSIS — Z23 Encounter for immunization: Secondary | ICD-10-CM | POA: Diagnosis not present

## 2020-02-25 DIAGNOSIS — Z1231 Encounter for screening mammogram for malignant neoplasm of breast: Secondary | ICD-10-CM | POA: Diagnosis not present

## 2020-03-15 DIAGNOSIS — Z01419 Encounter for gynecological examination (general) (routine) without abnormal findings: Secondary | ICD-10-CM | POA: Diagnosis not present

## 2020-03-15 DIAGNOSIS — Z8639 Personal history of other endocrine, nutritional and metabolic disease: Secondary | ICD-10-CM | POA: Diagnosis not present

## 2020-05-02 DIAGNOSIS — R1011 Right upper quadrant pain: Secondary | ICD-10-CM | POA: Diagnosis not present

## 2020-05-05 ENCOUNTER — Other Ambulatory Visit (HOSPITAL_COMMUNITY): Payer: Self-pay | Admitting: Internal Medicine

## 2020-05-05 DIAGNOSIS — R1011 Right upper quadrant pain: Secondary | ICD-10-CM

## 2020-05-09 ENCOUNTER — Encounter: Payer: Self-pay | Admitting: Emergency Medicine

## 2020-05-09 ENCOUNTER — Ambulatory Visit
Admission: EM | Admit: 2020-05-09 | Discharge: 2020-05-09 | Disposition: A | Discharge status: Home / Self Care | Payer: BC Managed Care – PPO | Attending: Emergency Medicine | Admitting: Emergency Medicine

## 2020-05-09 ENCOUNTER — Other Ambulatory Visit: Payer: Self-pay

## 2020-05-09 ENCOUNTER — Ambulatory Visit (HOSPITAL_COMMUNITY)
Admission: RE | Admit: 2020-05-09 | Discharge: 2020-05-09 | Disposition: A | Discharge status: Home / Self Care | Payer: BC Managed Care – PPO | Source: Ambulatory Visit | Attending: Internal Medicine | Admitting: Internal Medicine

## 2020-05-09 DIAGNOSIS — N281 Cyst of kidney, acquired: Secondary | ICD-10-CM | POA: Diagnosis not present

## 2020-05-09 DIAGNOSIS — Z9049 Acquired absence of other specified parts of digestive tract: Secondary | ICD-10-CM | POA: Diagnosis not present

## 2020-05-09 DIAGNOSIS — Z20822 Contact with and (suspected) exposure to covid-19: Secondary | ICD-10-CM

## 2020-05-09 DIAGNOSIS — R1011 Right upper quadrant pain: Secondary | ICD-10-CM | POA: Insufficient documentation

## 2020-05-09 DIAGNOSIS — J069 Acute upper respiratory infection, unspecified: Secondary | ICD-10-CM

## 2020-05-09 MED ORDER — FLUTICASONE PROPIONATE 50 MCG/ACT NA SUSP: 2.0000 | Freq: Every day | NASAL | 0 refills | Status: AC

## 2020-05-09 MED ORDER — IOHEXOL 300 MG/ML  SOLN
100.0000 mL | Freq: Once | INTRAMUSCULAR | Status: AC | PRN
  Administered 2020-05-09: 100 mL via INTRAVENOUS

## 2020-05-09 MED ORDER — BENZONATATE 100 MG PO CAPS: 100.0000 mg | ORAL_CAPSULE | Freq: Three times a day (TID) | ORAL | 0 refills | Status: AC

## 2020-05-09 NOTE — Discharge Instructions (Addendum)
COVID testing ordered.  It will take between 2-5 days for test results.  Someone will contact you regarding abnormal results.    In the meantime: You should remain isolated in your home for 10 days from symptom onset AND greater than 72 hours after symptoms resolution (absence of fever without the use of fever-reducing medication and improvement in respiratory symptoms), whichever is longer Get plenty of rest and push fluids Tessalon Perles prescribed for cough Use OTC antihistamine for nasal congestion, runny nose, and/or sore throat Flonase for nasal congestion and runny nose Use medications daily for symptom relief Use OTC medications like ibuprofen or tylenol as needed fever or pain Call or go to the ED if you have any new or worsening symptoms such as fever, worsening cough, shortness of breath, chest tightness, chest pain, turning blue, changes in mental status, etc..Marland Kitchen

## 2020-05-09 NOTE — ED Provider Notes (Signed)
[facility]   [id number] 05/09/20 Arrival Time: 1525   CC: COVID symptoms  SUBJECTIVE: History from: patient.  Carrie [name] is a 44 y.o. female who presents with headache and cough x 2 days.  Positive COVID exposure 4 days ago.  Has tried OTC medications without relief.  Deneis aggravating factors.  Denies previous symptoms in the past.   Had both COVID vaccines.  Denies fever, chills, fatigue, rhinorrhea, sore throat, SOB, wheezing, chest pain, nausea, changes in bowel or bladder habits.     ROS: As per HPI.  All other pertinent ROS negative.     Past Medical History:  Diagnosis Date  . TIA (transient ischemic attack) 03/2018   Past Surgical History:  Procedure Laterality Date  . APPENDECTOMY    . CHOLECYSTECTOMY N/A 08/12/2018   Procedure: LAPAROSCOPIC CHOLECYSTECTOMY;  Surgeon: Lucretia Roers, MD;  Location: [facility];  Service: General;  Laterality: N/A;  . TUBAL LIGATION     Allergies  Allergen Reactions  . Shellfish Allergy Anaphylaxis and Swelling  . Ciprofloxacin Hives   No current facility-administered medications on file prior to encounter.   Current Outpatient Medications on File Prior to Encounter  Medication Sig Dispense Refill  . aspirin 81 MG chewable tablet Chew 1 tablet (81 mg total) by mouth daily.    . Multiple Vitamins-Minerals (ALIVE WOMENS ENERGY PO) Take 1 tablet by mouth daily.    . simvastatin (ZOCOR) 20 MG tablet Take 1 tablet (20 mg total) by mouth daily at 6 PM. 30 tablet 2   Social History   Socioeconomic History  . Marital status: Married    Spouse name: Not on file  . Number of children: Not on file  . Years of education: Not on file  . Highest education level: Not on file  Occupational History  . Not on file  Tobacco Use  . Smoking status: Never Smoker  . Smokeless tobacco: Never Used  Substance and Sexual Activity  . Alcohol use: No  . Drug use: No  . Sexual activity: Yes    Birth control/protection: Surgical    Other Topics Concern  . Not on file  Social History Narrative  . Not on file   Social Determinants of Health   Financial Resource Strain:   . Difficulty of Paying Living Expenses:   Food Insecurity:   . Worried About Programme researcher, broadcasting/film/video in the Last Year:   . Barista in the Last Year:   Transportation Needs:   . Freight forwarder (Medical):   Marland Kitchen Lack of Transportation (Non-Medical):   Physical Activity:   . Days of Exercise per Week:   . Minutes of Exercise per Session:   Stress:   . Feeling of Stress :   Social Connections:   . Frequency of Communication with Friends and Family:   . Frequency of Social Gatherings with Friends and Family:   . Attends Religious Services:   . Active Member of Clubs or Organizations:   . Attends Banker Meetings:   Marland Kitchen Marital Status:   Intimate Partner Violence:   . Fear of Current or Ex-Partner:   . Emotionally Abused:   Marland Kitchen Physically Abused:   . Sexually Abused:    Family History  Problem Relation Age of Onset  . Hypertension Mother   . Colon cancer Mother   . Hypertension Father     OBJECTIVE:  Vitals:   05/09/20 1556  BP: 126/87  Pulse: (!) 101  Resp: 16  Temp: 99.3 F (37.4 C)  TempSrc: Oral  SpO2: 96%     General appearance: alert; mildly fatigued, nontoxic; speaking in full sentences and tolerating own secretions HEENT: NCAT; Ears: EACs clear, TMs pearly gray; Eyes: PERRL.  EOM grossly intact. Nose: nares patent without rhinorrhea, Throat: oropharynx clear, tonsils non erythematous or enlarged, uvula midline  Neck: supple without LAD Lungs: unlabored respirations, symmetrical air entry; cough: absent; no respiratory distress; CTAB Heart: regular rate and rhythm.  Skin: warm and dry Psychological: alert and cooperative; normal mood and affect   ASSESSMENT & PLAN:  1. Viral URI with cough   2. Suspected COVID-19 virus infection     Meds ordered this encounter  Medications  . benzonatate  (TESSALON) 100 MG capsule    Sig: Take 1 capsule (100 mg total) by mouth every 8 (eight) hours.    Dispense:  21 capsule    Refill:  0    Order Specific Question:   Supervising Provider    Answer:   Eustace Moore [[id number]]  . fluticasone (FLONASE) 50 MCG/ACT nasal spray    Sig: Place 2 sprays into both nostrils daily.    Dispense:  16 g    Refill:  0    Order Specific Question:   Supervising Provider    Answer:   Eustace Moore [[id number]]   COVID testing ordered.  It will take between 2-5 days for test results.  Someone will contact you regarding abnormal results.    In the meantime: You should remain isolated in your home for 10 days from symptom onset AND greater than 72 hours after symptoms resolution (absence of fever without the use of fever-reducing medication and improvement in respiratory symptoms), whichever is longer  Get plenty of rest and push fluids Tessalon Perles prescribed for cough Use OTC antihistamine for nasal congestion, runny nose, and/or sore throat Flonase for nasal congestion and runny nose Use medications daily for symptom relief Use OTC medications like ibuprofen or tylenol as needed fever or pain Call or go to the ED if you have any new or worsening symptoms such as fever, worsening cough, shortness of breath, chest tightness, chest pain, turning blue, changes in mental status, etc...   Reviewed expectations re: course of current medical issues. Questions answered. Outlined signs and symptoms indicating need for more acute intervention. Patient verbalized understanding. After Visit Summary given.         Rennis Harding, PA-C 05/09/20 1603

## 2020-05-09 NOTE — ED Triage Notes (Signed)
Pt triaged and discharged by provider prior to RN  

## 2020-05-10 LAB — NOVEL CORONAVIRUS, NAA: SARS-CoV-2, NAA: DETECTED — AB

## 2020-05-10 LAB — SARS-COV-2, NAA 2 DAY TAT

## 2020-05-18 ENCOUNTER — Other Ambulatory Visit: Payer: Self-pay

## 2020-05-18 ENCOUNTER — Ambulatory Visit
Admission: EM | Admit: 2020-05-18 | Discharge: 2020-05-18 | Discharge status: Absent without leave | Payer: BC Managed Care – PPO

## 2021-04-02 DIAGNOSIS — Z1151 Encounter for screening for human papillomavirus (HPV): Secondary | ICD-10-CM | POA: Diagnosis not present

## 2021-04-02 DIAGNOSIS — Z01419 Encounter for gynecological examination (general) (routine) without abnormal findings: Secondary | ICD-10-CM | POA: Diagnosis not present

## 2021-04-02 DIAGNOSIS — Z6827 Body mass index (BMI) 27.0-27.9, adult: Secondary | ICD-10-CM | POA: Diagnosis not present

## 2021-04-02 DIAGNOSIS — E039 Hypothyroidism, unspecified: Secondary | ICD-10-CM | POA: Diagnosis not present

## 2021-07-12 DIAGNOSIS — Z0000 Encounter for general adult medical examination without abnormal findings: Secondary | ICD-10-CM | POA: Diagnosis not present

## 2021-07-19 DIAGNOSIS — D509 Iron deficiency anemia, unspecified: Secondary | ICD-10-CM | POA: Diagnosis not present

## 2021-07-19 DIAGNOSIS — Z6829 Body mass index (BMI) 29.0-29.9, adult: Secondary | ICD-10-CM | POA: Diagnosis not present

## 2021-07-19 DIAGNOSIS — Z0000 Encounter for general adult medical examination without abnormal findings: Secondary | ICD-10-CM | POA: Diagnosis not present

## 2021-07-19 DIAGNOSIS — Z1211 Encounter for screening for malignant neoplasm of colon: Secondary | ICD-10-CM | POA: Diagnosis not present

## 2021-08-22 ENCOUNTER — Telehealth: Payer: Self-pay | Admitting: *Deleted

## 2021-08-22 NOTE — Telephone Encounter (Signed)
Patient called and spoke with Dr. Dellis Anes requesting a refill for her EpiPen. He was going to refill the EpiPen but we pointed out to Dr. Dellis Anes that she has not been seen in over 4 years and she is considered a new patient. I spoke to the patient and advised that she will need a new patient appointment to be seen in order to get a refill and we were booked out until October 17 2021. Patient stated that son was coming in for an appointment and could she come then. I did advise that unfortunately it would need to be a new patient appointment. 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 She stated that she is about to go out of town. I advised to try going to an urgent care to see if they will prescribe the EpiPen since she is pressed for time. Patient verbalized understanding.

## 2021-12-27 DIAGNOSIS — M7661 Achilles tendinitis, right leg: Secondary | ICD-10-CM | POA: Diagnosis not present

## 2022-01-03 DIAGNOSIS — Z1231 Encounter for screening mammogram for malignant neoplasm of breast: Secondary | ICD-10-CM | POA: Diagnosis not present

## 2022-04-09 DIAGNOSIS — Z01419 Encounter for gynecological examination (general) (routine) without abnormal findings: Secondary | ICD-10-CM | POA: Diagnosis not present

## 2022-04-09 DIAGNOSIS — Z6829 Body mass index (BMI) 29.0-29.9, adult: Secondary | ICD-10-CM | POA: Diagnosis not present

## 2022-04-09 DIAGNOSIS — E039 Hypothyroidism, unspecified: Secondary | ICD-10-CM | POA: Diagnosis not present

## 2022-04-09 DIAGNOSIS — N951 Menopausal and female climacteric states: Secondary | ICD-10-CM | POA: Diagnosis not present

## 2022-12-09 DIAGNOSIS — D500 Iron deficiency anemia secondary to blood loss (chronic): Secondary | ICD-10-CM | POA: Diagnosis not present

## 2022-12-16 DIAGNOSIS — Z800 Family history of malignant neoplasm of digestive organs: Secondary | ICD-10-CM | POA: Diagnosis not present

## 2022-12-16 DIAGNOSIS — R002 Palpitations: Secondary | ICD-10-CM | POA: Diagnosis not present

## 2022-12-16 DIAGNOSIS — Z23 Encounter for immunization: Secondary | ICD-10-CM | POA: Diagnosis not present

## 2023-01-06 DIAGNOSIS — Z1231 Encounter for screening mammogram for malignant neoplasm of breast: Secondary | ICD-10-CM | POA: Diagnosis not present

## 2023-04-04 DIAGNOSIS — H8110 Benign paroxysmal vertigo, unspecified ear: Secondary | ICD-10-CM | POA: Diagnosis not present

## 2023-04-21 DIAGNOSIS — Z800 Family history of malignant neoplasm of digestive organs: Secondary | ICD-10-CM | POA: Diagnosis not present

## 2023-04-21 DIAGNOSIS — Z1211 Encounter for screening for malignant neoplasm of colon: Secondary | ICD-10-CM | POA: Diagnosis not present

## 2023-04-21 DIAGNOSIS — D125 Benign neoplasm of sigmoid colon: Secondary | ICD-10-CM | POA: Diagnosis not present

## 2023-04-21 DIAGNOSIS — D123 Benign neoplasm of transverse colon: Secondary | ICD-10-CM | POA: Diagnosis not present

## 2023-12-02 DIAGNOSIS — N951 Menopausal and female climacteric states: Secondary | ICD-10-CM | POA: Diagnosis not present

## 2023-12-02 DIAGNOSIS — N926 Irregular menstruation, unspecified: Secondary | ICD-10-CM | POA: Diagnosis not present

## 2023-12-02 DIAGNOSIS — E039 Hypothyroidism, unspecified: Secondary | ICD-10-CM | POA: Diagnosis not present

## 2023-12-02 DIAGNOSIS — Z01419 Encounter for gynecological examination (general) (routine) without abnormal findings: Secondary | ICD-10-CM | POA: Diagnosis not present

## 2023-12-02 DIAGNOSIS — Z1151 Encounter for screening for human papillomavirus (HPV): Secondary | ICD-10-CM | POA: Diagnosis not present

## 2023-12-02 DIAGNOSIS — Z6830 Body mass index (BMI) 30.0-30.9, adult: Secondary | ICD-10-CM | POA: Diagnosis not present

## 2024-01-01 DIAGNOSIS — T7840XA Allergy, unspecified, initial encounter: Secondary | ICD-10-CM | POA: Diagnosis not present

## 2024-01-08 DIAGNOSIS — Z1231 Encounter for screening mammogram for malignant neoplasm of breast: Secondary | ICD-10-CM | POA: Diagnosis not present

## 2024-01-23 DIAGNOSIS — K1379 Other lesions of oral mucosa: Secondary | ICD-10-CM | POA: Diagnosis not present

## 2024-01-23 DIAGNOSIS — K1329 Other disturbances of oral epithelium, including tongue: Secondary | ICD-10-CM | POA: Diagnosis not present

## 2024-01-29 DIAGNOSIS — R000 Tachycardia, unspecified: Secondary | ICD-10-CM | POA: Diagnosis not present

## 2024-01-29 DIAGNOSIS — Z0001 Encounter for general adult medical examination with abnormal findings: Secondary | ICD-10-CM | POA: Diagnosis not present

## 2024-01-29 DIAGNOSIS — Z800 Family history of malignant neoplasm of digestive organs: Secondary | ICD-10-CM | POA: Diagnosis not present

## 2024-02-05 DIAGNOSIS — E611 Iron deficiency: Secondary | ICD-10-CM | POA: Diagnosis not present

## 2024-02-05 DIAGNOSIS — Z0000 Encounter for general adult medical examination without abnormal findings: Secondary | ICD-10-CM | POA: Diagnosis not present

## 2024-02-05 DIAGNOSIS — Z0001 Encounter for general adult medical examination with abnormal findings: Secondary | ICD-10-CM | POA: Diagnosis not present

## 2024-05-14 DIAGNOSIS — D500 Iron deficiency anemia secondary to blood loss (chronic): Secondary | ICD-10-CM | POA: Diagnosis not present

## 2024-05-14 DIAGNOSIS — Z800 Family history of malignant neoplasm of digestive organs: Secondary | ICD-10-CM | POA: Diagnosis not present

## 2024-05-14 DIAGNOSIS — R000 Tachycardia, unspecified: Secondary | ICD-10-CM | POA: Diagnosis not present

## 2024-05-14 DIAGNOSIS — E039 Hypothyroidism, unspecified: Secondary | ICD-10-CM | POA: Diagnosis not present

## 2024-05-21 DIAGNOSIS — D509 Iron deficiency anemia, unspecified: Secondary | ICD-10-CM | POA: Diagnosis not present

## 2024-05-21 DIAGNOSIS — E039 Hypothyroidism, unspecified: Secondary | ICD-10-CM | POA: Diagnosis not present

## 2024-08-26 DIAGNOSIS — D500 Iron deficiency anemia secondary to blood loss (chronic): Secondary | ICD-10-CM | POA: Diagnosis not present

## 2024-08-26 DIAGNOSIS — Z800 Family history of malignant neoplasm of digestive organs: Secondary | ICD-10-CM | POA: Diagnosis not present

## 2024-08-26 DIAGNOSIS — R000 Tachycardia, unspecified: Secondary | ICD-10-CM | POA: Diagnosis not present

## 2024-08-26 DIAGNOSIS — E039 Hypothyroidism, unspecified: Secondary | ICD-10-CM | POA: Diagnosis not present

## 2024-08-27 DIAGNOSIS — Z23 Encounter for immunization: Secondary | ICD-10-CM | POA: Diagnosis not present

## 2024-08-27 DIAGNOSIS — D509 Iron deficiency anemia, unspecified: Secondary | ICD-10-CM | POA: Diagnosis not present

## 2024-08-27 DIAGNOSIS — E039 Hypothyroidism, unspecified: Secondary | ICD-10-CM | POA: Diagnosis not present
